# Patient Record
Sex: Male | Born: 1995 | Race: Black or African American | Hispanic: No | Marital: Single | Smoking: Current some day smoker
Health system: Southern US, Community
[De-identification: ages and names within clinical notes are randomized; demographics above are authoritative.]

## PROBLEM LIST (undated history)

## (undated) DIAGNOSIS — Z9889 Other specified postprocedural states: Secondary | ICD-10-CM

## (undated) DIAGNOSIS — M1990 Unspecified osteoarthritis, unspecified site: Secondary | ICD-10-CM

## (undated) HISTORY — PX: KNEE SURGERY: SHX244

## (undated) HISTORY — PX: FEMUR FRACTURE SURGERY: SHX633

---
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
## 2013-02-14 ENCOUNTER — Observation Stay (HOSPITAL_COMMUNITY)
Admission: EM | Admit: 2013-02-14 | Discharge: 2013-02-15 | Payer: Medicaid Other | Attending: Orthopedic Surgery | Admitting: Orthopedic Surgery

## 2013-02-14 ENCOUNTER — Emergency Department (HOSPITAL_COMMUNITY): Payer: Medicaid Other

## 2013-02-14 ENCOUNTER — Encounter (HOSPITAL_COMMUNITY): Payer: Self-pay

## 2013-02-14 DIAGNOSIS — W19XXXA Unspecified fall, initial encounter: Secondary | ICD-10-CM | POA: Insufficient documentation

## 2013-02-14 DIAGNOSIS — S72413A Displaced unspecified condyle fracture of lower end of unspecified femur, initial encounter for closed fracture: Principal | ICD-10-CM | POA: Insufficient documentation

## 2013-02-14 DIAGNOSIS — M083 Juvenile rheumatoid polyarthritis (seronegative): Secondary | ICD-10-CM | POA: Insufficient documentation

## 2013-02-14 HISTORY — DX: Unspecified osteoarthritis, unspecified site: M19.90

## 2013-02-14 HISTORY — DX: Other specified postprocedural states: Z98.890

## 2013-02-14 LAB — CBC WITH DIFFERENTIAL/PLATELET
Basophils Absolute: 0 10*3/uL (ref 0.0–0.1)
Basophils Relative: 0 % (ref 0–1)
Eosinophils Absolute: 0 10*3/uL (ref 0.0–0.7)
Eosinophils Relative: 0 % (ref 0–5)
HCT: 40.2 % (ref 39.0–52.0)
Hemoglobin: 12.9 g/dL — ABNORMAL LOW (ref 13.0–17.0)
Lymphocytes Relative: 8 % — ABNORMAL LOW (ref 12–46)
Lymphs Abs: 2 10*3/uL (ref 0.7–4.0)
MCH: 26.1 pg (ref 26.0–34.0)
MCHC: 32.1 g/dL (ref 30.0–36.0)
MCV: 81.4 fL (ref 78.0–100.0)
Monocytes Absolute: 1.2 10*3/uL — ABNORMAL HIGH (ref 0.1–1.0)
Monocytes Relative: 5 % (ref 3–12)
Neutro Abs: 20.3 10*3/uL — ABNORMAL HIGH (ref 1.7–7.7)
Neutrophils Relative %: 86 % — ABNORMAL HIGH (ref 43–77)
Platelets: 389 10*3/uL (ref 150–400)
RBC: 4.94 MIL/uL (ref 4.22–5.81)
RDW: 14.1 % (ref 11.5–15.5)
WBC: 23.6 10*3/uL — ABNORMAL HIGH (ref 4.0–10.5)

## 2013-02-14 LAB — BASIC METABOLIC PANEL
BUN: 8 mg/dL (ref 6–23)
CO2: 26 mEq/L (ref 19–32)
Calcium: 8.9 mg/dL (ref 8.4–10.5)
Chloride: 102 mEq/L (ref 96–112)
Creatinine, Ser: 0.65 mg/dL (ref 0.50–1.35)
GFR calc Af Amer: >90 mL/min (ref >90)
GFR calc non Af Amer: >90 mL/min (ref >90)
Glucose, Bld: 104 mg/dL — ABNORMAL HIGH (ref 70–99)
Potassium: 3.6 mEq/L (ref 3.5–5.1)
Sodium: 139 mEq/L (ref 135–145)

## 2013-02-14 LAB — TYPE AND SCREEN
ABO/RH(D): B POS
Antibody Screen: NEGATIVE

## 2013-02-14 MED ORDER — HYDROMORPHONE HCL PF 1 MG/ML IJ SOLN
1.0000 mg | Freq: Once | INTRAMUSCULAR | Status: AC
  Administered 2013-02-14: 1 mg via INTRAVENOUS
  Filled 2013-02-14: qty 1

## 2013-02-14 MED ORDER — FENTANYL CITRATE 0.05 MG/ML IJ SOLN
100.0000 ug | Freq: Once | INTRAMUSCULAR | Status: AC
  Administered 2013-02-14: 100 ug via INTRAVENOUS

## 2013-02-14 MED ORDER — SODIUM CHLORIDE 0.9 % IV SOLN
Freq: Once | INTRAVENOUS | Status: AC
  Administered 2013-02-14: 16:00:00 via INTRAVENOUS

## 2013-02-14 MED ORDER — HYDROMORPHONE HCL PF 1 MG/ML IJ SOLN
INTRAMUSCULAR | Status: AC
  Administered 2013-02-15: 1 mg via INTRAVENOUS
  Filled 2013-02-14: qty 1

## 2013-02-14 MED ORDER — FENTANYL CITRATE 0.05 MG/ML IJ SOLN
50.0000 ug | INTRAMUSCULAR | Status: DC | PRN
  Administered 2013-02-14 – 2013-02-15 (×7): 50 ug via INTRAVENOUS
  Filled 2013-02-14 (×7): qty 2

## 2013-02-14 MED ORDER — HYDROMORPHONE HCL PF 1 MG/ML IJ SOLN
1.0000 mg | INTRAMUSCULAR | Status: AC | PRN
  Administered 2013-02-14 – 2013-02-15 (×3): 1 mg via INTRAVENOUS
  Filled 2013-02-14 (×3): qty 1

## 2013-02-14 MED ORDER — FENTANYL CITRATE 0.05 MG/ML IJ SOLN
INTRAMUSCULAR | Status: AC
  Filled 2013-02-14: qty 2

## 2013-02-14 NOTE — ED Notes (Signed)
Pt reports stepped off of porch and accidentally stepped on his pants and fell.  C/O pain to r knee. Deformity noted.  Pt.  Pt tearful.

## 2013-02-14 NOTE — ED Notes (Signed)
Attempted to call report. Was advised nurse receiving pt will call this nurse back for report. 

## 2013-02-14 NOTE — ED Provider Notes (Signed)
History     This chart was scribed for Vida Roller, MD, MD by Smitty Pluck, ED Scribe. The patient was seen in room APA10/APA10 and the patient's care was started at 3:22 PM.   CSN: [id number]  Arrival date & time 02/14/13  1520   None     Chief Complaint  Patient presents with  . Knee Pain    The history is provided by the patient, a parent and medical records. No language interpreter was used.   Billy Stevenson is a 17 y.o. male with hx of juvenile RA who presents to the Emergency Department complaining of constant, severe right knee pain associated with swelling and deformity with sudden onset today. Pt reports that he stepped down on his right leg and caught his leg on his jeans causing him to fall and land on right knee He denies LOC, fever, chills, nausea, vomiting, diarrhea, weakness, cough, SOB and any other pain.   Past Medical History  Diagnosis Date  . Arthritis     juvenile  . Status post arthroscopy of left knee     meniscal repair    Past Surgical History  Procedure Laterality Date  . Knee surgery Left     meniscal repair    No family history on file.  History  Substance Use Topics  . Smoking status: Not on file  . Smokeless tobacco: Not on file  . Alcohol Use: Yes     Comment: occ      Review of Systems  Musculoskeletal: Positive for arthralgias. Negative for back pain.  Neurological: Negative for syncope, numbness and headaches.  All other systems reviewed and are negative.    Allergies  Other  Home Medications   Current Outpatient Rx  Name  Route  Sig  Dispense  Refill  . calcium-vitamin D (OSCAL WITH D) 500-200 MG-UNIT per tablet   Oral   Take 1 tablet by mouth daily.         . diclofenac (VOLTAREN) 50 MG EC tablet   Oral   Take 50 mg by mouth 2 (two) times daily.         . folic acid (FOLVITE) 1 MG tablet   Oral   Take 1 mg by mouth daily.         . methotrexate 1 G injection   Intravenous   Inject 25 mg into the vein  once a week. On Friday           BP 136/72  Pulse 92  Temp(Src) 98 F (36.7 C) (Oral)  Resp 20  Ht 5\' 6"  (1.676 m)  Wt 212 lb (96.163 kg)  BMI 34.23 kg/m2  SpO2 98%  Physical Exam  Nursing note and vitals reviewed. Constitutional: He is oriented to person, place, and time. He appears well-developed and well-nourished. He appears distressed.  Tearful   HENT:  Head: Normocephalic and atraumatic.  Eyes: Conjunctivae are normal. Pupils are equal, round, and reactive to light.  Neck: Normal range of motion. Neck supple.  Cardiovascular: Regular rhythm and normal heart sounds.  Tachycardia present.   No murmur heard. Pulse are nl at feet bilaterally  Pulmonary/Chest: Effort normal and breath sounds normal. No respiratory distress. He has no wheezes. He has no rales.  Abdominal: Soft. Bowel sounds are normal. He exhibits no distension. There is no tenderness. There is no rebound and no guarding.  Musculoskeletal:  Swollen right knee Hold in flex position All other joints of Upper Extremities and Lower Extremities  appear nl and are supple  Neurological: He is alert and oriented to person, place, and time.  Skin: Skin is warm and dry.  Psychiatric: He has a normal mood and affect. His behavior is normal.    ED Course  Procedures (including critical care time) DIAGNOSTIC STUDIES: Oxygen Saturation is 98% on room air, normal by my interpretation.    COORDINATION OF CARE: 3:26 PM Discussed ED treatment with pt and pt agrees.     Labs Reviewed  CBC WITH DIFFERENTIAL - Abnormal; Notable for the following:    WBC 23.6 (*)    Hemoglobin 12.9 (*)    Neutrophils Relative 86 (*)    Neutro Abs 20.3 (*)    Lymphocytes Relative 8 (*)    Monocytes Absolute 1.2 (*)    All other components within normal limits  BASIC METABOLIC PANEL - Abnormal; Notable for the following:    Glucose, Bld 104 (*)    All other components within normal limits  TYPE AND SCREEN   Ct Knee Right Wo  Contrast  02/14/2013  *RADIOLOGY REPORT*  Clinical Data: Knee pain post fall, question femoral metaphyseal fracture on knee radiographs.  CT OF THE RIGHT KNEE WITHOUT CONTRAST  Technique:  Multidetector CT imaging was performed according to the standard protocol. Multiplanar CT image reconstructions were also generated. Knee imaged in flexion with nonstandard axial images.  Comparison: Right knee radiographs 02/14/2013  Findings: Marked osseous demineralization. Patella, tibia, and fibula intact. Oblique mildly displaced fracture of the lateral femoral condyle, with fracture plane extending into the intercondylar notch. Tricompartmental osteoarthritic changes of joint space narrowing marginal spur formation greatest at patellofemoral joint. Additional transverse fracture plane is seen at the distal femoral metaphysis extending transversely just above the femoral condyles, minimally displaced. No additional femoral fractures identified. Minimal knee joint effusion and regional soft tissue swelling.  IMPRESSION: Marked osseous demineralization. Comminuted distal right femoral fracture with a transverse metaphyseal fracture plane minimally displaced and a minimally displaced oblique fracture plane across the lateral femoral condyle into the intercondylar notch. Tricompartmental osteoarthritic changes.   Original Report Authenticated By: Ulyses Southward, M.D.    Dg Knee Ap/lat W/sunrise Right  02/14/2013  *RADIOLOGY REPORT*  Clinical Data: Pain after fall.  DG KNEE - 3 VIEWS  Comparison: None.  Findings: Projections are atypical because of patient positioning. No joint effusion is evident.  I think there is a fracture of the distal femoral metaphysis.  This may be slightly impacted.  If proper positioning cannot be achieved, CT scan could be considered for better evaluation.  IMPRESSION: Likely fracture of the femoral metaphysis.  Atypical positioning on these radiographs.  If proper positioning cannot be achieved, CT  scan could be considered.   Original Report Authenticated By: Paulina Fusi, M.D.      1. Fracture, femur, distal, right, closed, initial encounter       MDM  The patient has a distal right femoral fracture which is closed, I have discussed the patient's care with Dr. Romeo Apple who agrees that the patient needs to be admitted to the hospital, he will work on finding appropriate for surgical care for the patient given his complicating rheumatoid disease. The patient's pain has been relatively well controlled with fentanyl and Dilaudid, his lab work showed that he is not anemic, he has a significant leukocytosis likely related to the stress of the injury and his medication use. He will be splinted in a position of comfort which is likely in a flexed position and temporary holding  orders will be written by myself at the request of the admitting doctor, patient and family members informed of the results and plan of care.    I personally performed the services described in this documentation, which was scribed in my presence. The recorded information has been reviewed and is accurate.         Vida Roller, MD 02/14/13 [phone]

## 2013-02-15 ENCOUNTER — Encounter (HOSPITAL_COMMUNITY): Payer: Self-pay | Admitting: *Deleted

## 2013-02-15 MED ORDER — PNEUMOCOCCAL VAC POLYVALENT 25 MCG/0.5ML IJ INJ
0.5000 mL | INJECTION | INTRAMUSCULAR | Status: DC
  Filled 2013-02-15: qty 0.5

## 2013-02-15 MED ORDER — ONDANSETRON HCL 4 MG/2ML IJ SOLN: 4.0000 mg | Freq: Three times a day (TID) | INTRAMUSCULAR | Status: AC | PRN

## 2013-02-15 MED ORDER — SODIUM CHLORIDE 0.9 % IJ SOLN
10.0000 mL | INTRAMUSCULAR | Status: DC | PRN
  Administered 2013-02-15: 10 mL via INTRAVENOUS

## 2013-02-15 MED ORDER — SODIUM CHLORIDE 0.9 % IV SOLN: INTRAVENOUS | Status: DC

## 2013-02-15 NOTE — Progress Notes (Signed)
Attempted to call report. Told nurse would call me back

## 2013-02-15 NOTE — H&P (Signed)
Billy Stevenson is an 17 y.o. male.   Chief Complaint: Pain right femur/knee HPI: 17 year old male followed at [facility] for rheumatoid arthritis presents with fall and deformity of right distal femur. Patient cannot ambulate complains of severe pain around his knee and distal femur not associated with any numbness or tingling. He was checking out houses feet became painful he landed on a flexed knee and cannot straighten his knee at this time. His x-rays show a distal femur fracture which was confirmed by CT scan. He has significant demineralization of his bone with an intra-articular fracture with a transverse component so call T-type distal femur fracture with little angulation or displacement. He is ambulatory normally.  Past Medical History  Diagnosis Date  . Arthritis     juvenile  . Status post arthroscopy of left knee     meniscal repair    Past Surgical History  Procedure Laterality Date  . Knee surgery Left     meniscal repair    History reviewed. No pertinent family history. Social History:  reports that he has been smoking Cigars.  He does not have any smokeless tobacco history on file. He reports that  drinks alcohol. He reports that he uses illicit drugs (Marijuana).  Allergies:  Allergies  Allergen Reactions  . Other     nuts    Medications Prior to Admission  Medication Sig Dispense Refill  . calcium-vitamin D (OSCAL WITH D) 500-200 MG-UNIT per tablet Take 1 tablet by mouth daily.      . diclofenac (VOLTAREN) 50 MG EC tablet Take 50 mg by mouth 2 (two) times daily.      . folic acid (FOLVITE) 1 MG tablet Take 1 mg by mouth daily.      . methotrexate 1 G injection Inject 25 mg into the vein once a week. On Friday        Results for orders placed during the hospital encounter of 02/14/13 (from the past 48 hour(s))  CBC WITH DIFFERENTIAL     Status: Abnormal   Collection Time    02/14/13  5:45 PM      Result Value Range   WBC 23.6 (*) 4.0 - 10.5 K/uL   RBC 4.94  4.22  - 5.81 MIL/uL   Hemoglobin 12.9 (*) 13.0 - 17.0 g/dL   HCT 29.5  62.1 - 30.8 %   MCV 81.4  78.0 - 100.0 fL   MCH 26.1  26.0 - 34.0 pg   MCHC 32.1  30.0 - 36.0 g/dL   RDW 65.7  84.6 - 96.2 %   Platelets 389  150 - 400 K/uL   Neutrophils Relative 86 (*) 43 - 77 %   Neutro Abs 20.3 (*) 1.7 - 7.7 K/uL   Lymphocytes Relative 8 (*) 12 - 46 %   Lymphs Abs 2.0  0.7 - 4.0 K/uL   Monocytes Relative 5  3 - 12 %   Monocytes Absolute 1.2 (*) 0.1 - 1.0 K/uL   Eosinophils Relative 0  0 - 5 %   Eosinophils Absolute 0.0  0.0 - 0.7 K/uL   Basophils Relative 0  0 - 1 %   Basophils Absolute 0.0  0.0 - 0.1 K/uL  BASIC METABOLIC PANEL     Status: Abnormal   Collection Time    02/14/13  5:45 PM      Result Value Range   Sodium 139  135 - 145 mEq/L   Potassium 3.6  3.5 - 5.1 mEq/L   Chloride 102  96 - 112 mEq/L   CO2 26  19 - 32 mEq/L   Glucose, Bld 104 (*) 70 - 99 mg/dL   BUN 8  6 - 23 mg/dL   Creatinine, Ser 4.09  0.50 - 1.35 mg/dL   Calcium 8.9  8.4 - 81.1 mg/dL   GFR calc non Af Amer >90  >90 mL/min   GFR calc Af Amer >90  >90 mL/min   Comment:            The eGFR has been calculated     using the CKD EPI equation.     This calculation has not been     validated in all clinical     situations.     eGFR's persistently     <90 mL/min signify     possible Chronic Kidney Disease.  TYPE AND SCREEN     Status: None   Collection Time    02/14/13  5:45 PM      Result Value Range   ABO/RH(D) B POS     Antibody Screen NEG     Sample Expiration 02/17/2013     Ct Knee Right Wo Contrast  02/14/2013  *RADIOLOGY REPORT*  Clinical Data: Knee pain post fall, question femoral metaphyseal fracture on knee radiographs.  CT OF THE RIGHT KNEE WITHOUT CONTRAST  Technique:  Multidetector CT imaging was performed according to the standard protocol. Multiplanar CT image reconstructions were also generated. Knee imaged in flexion with nonstandard axial images.  Comparison: Right knee radiographs 02/14/2013   Findings: Marked osseous demineralization. Patella, tibia, and fibula intact. Oblique mildly displaced fracture of the lateral femoral condyle, with fracture plane extending into the intercondylar notch. Tricompartmental osteoarthritic changes of joint space narrowing marginal spur formation greatest at patellofemoral joint. Additional transverse fracture plane is seen at the distal femoral metaphysis extending transversely just above the femoral condyles, minimally displaced. No additional femoral fractures identified. Minimal knee joint effusion and regional soft tissue swelling.  IMPRESSION: Marked osseous demineralization. Comminuted distal right femoral fracture with a transverse metaphyseal fracture plane minimally displaced and a minimally displaced oblique fracture plane across the lateral femoral condyle into the intercondylar notch. Tricompartmental osteoarthritic changes.   Original Report Authenticated By: Ulyses Southward, M.D.    Dg Knee Ap/lat W/sunrise Right  02/14/2013  *RADIOLOGY REPORT*  Clinical Data: Pain after fall.  DG KNEE - 3 VIEWS  Comparison: None.  Findings: Projections are atypical because of patient positioning. No joint effusion is evident.  I think there is a fracture of the distal femoral metaphysis.  This may be slightly impacted.  If proper positioning cannot be achieved, CT scan could be considered for better evaluation.  IMPRESSION: Likely fracture of the femoral metaphysis.  Atypical positioning on these radiographs.  If proper positioning cannot be achieved, CT scan could be considered.   Original Report Authenticated By: Paulina Fusi, M.D.     ROS  Blood pressure 145/79, pulse 100, temperature 99.2 F (37.3 C), temperature source Oral, resp. rate 19, height 5\' 6"  (1.676 m), weight 212 lb (96.163 kg), SpO2 99.00%. Physical Exam  Vitals reviewed.  BP 145/79  Pulse 100  Temp(Src) 99.2 F (37.3 C) (Oral)  Resp 19  Ht 5\' 6"  (1.676 m)  Wt 212 lb (96.163 kg)  BMI 34.23  kg/m2  SpO2 99% The patient is awake alert and oriented x3 he is comfortable after pain medication. He has a body habitus which are short round and mildly obese. His mood and affect are normal.  He cannot ambulate.  Upper extremity exam  The right and left upper extremity:   Inspection and palpation revealed no abnormalities in the upper extremities.   Range of motion is full without contracture.  Motor exam is normal with grade 5 strength.  The joints are fully reduced without subluxation.  There is no atrophy or tremor and muscle tone is normal.  All joints are stable.   His left lower extremity has no tenderness, range of motion is full without contracture motor exam is normal grade 5 strength twice reduced without subluxation no atrophy muscle tone normal  Right leg is held in flexion compartments are soft is in a posterior splint  Assessment/Plan The patient is probably a candidate for surgical fixation based on his age. I am not convinced that this fracture will be stable treated with nonoperative means. He would also be difficult for him to regain normal range of motion in his right knee if it was kept in a cast for a long period of time.  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 The stomach fixation difficult. You also need management of his rheumatoid medications. He is currently on methotrexate he just saw his doctor at [facility] in the last week or 2.  I have spoken to him and recommended that he be transferred to [facility] for further management of his fracture and then his rheumatologist can assist in the management of his medications in the perioperative.  We have no experience with that he and the fracture fixation will be problematic as well.   IMPRESSION:  Marked osseous demineralization.  Comminuted distal right femoral fracture with a transverse  metaphyseal fracture  plane minimally displaced and a minimally  displaced oblique fracture plane across the lateral femoral condyle  into the intercondylar notch.  Tricompartmental osteoarthritic changes.  Our plan is to transfer him to [facility] for further management   Fuller Canada 02/15/2013, 7:41 AM

## 2013-02-15 NOTE — Discharge Summary (Signed)
Physician Discharge Summary  Patient ID: Billy Stevenson MRN: [id number] DOB/AGE: 23-Jan-1996 17 y.o.  Admit date: 02/14/2013 Discharge date: 02/15/2013  Admission Diagnoses: Right distal femur fracture  Discharge Diagnoses: Right distal femur fracture Active Problems:   * No active hospital problems. *   Discharged Condition: good  Hospital Course: The patient was admitted on observation status on March 12 secondary to right distal femur fracture with intra-articular component so-called T. type femur fracture. This placed in a splint at 90 flexion because he would not straighten his knee for what we believe to be secondary to pain. He did have a CT scan which showed the details of the fracture. He's been in a splint he was evaluated this morning approximately 7:30 and was neurovascularly intact with soft compartments. He was comfortable with IV pain medication.  Consults: None  Significant Diagnostic Studies: CT scan  Treatments: IV hydration and analgesia: Dilaudid  Discharge Exam: Blood pressure 145/79, pulse 100, temperature 99.2 F (37.3 C), temperature source Oral, resp. rate 19, height 5\' 6"  (1.676 m), weight 212 lb (96.163 kg), SpO2 99.00%. Physical examination he has a wound tie shaped face which may or may not be syndromic. He has mild obesity. He has a splint on his right leg with soft compartments of the thigh and calf.  Disposition: Final discharge disposition not confirmed disposition [facility]  Discharge Orders   Future Orders Complete By Expires     Diet - low sodium heart healthy  As directed     Discharge instructions  As directed     Comments:      Continue right lower leg splint    Increase activity slowly  As directed         Medication List    STOP taking these medications       diclofenac 50 MG EC tablet  Commonly known as:  VOLTAREN      TAKE these medications       calcium-vitamin D 500-200 MG-UNIT per tablet   Commonly known as:  OSCAL WITH D  Take 1 tablet by mouth daily.     folic acid 1 MG tablet  Commonly known as:  FOLVITE  Take 1 mg by mouth daily.     methotrexate 1 G injection  Inject 25 mg into the vein once a week. On Friday         Signed: Fuller Canada 02/15/2013, 10:19 AM

## 2013-02-15 NOTE — Plan of Care (Signed)
Problem: Discharge Progression Outcomes Goal: Discharge plan in place and appropriate Outcome: Adequate for Discharge Pt transferred to [facility]

## 2013-05-19 ENCOUNTER — Encounter (HOSPITAL_COMMUNITY): Payer: Self-pay

## 2013-05-19 ENCOUNTER — Emergency Department (HOSPITAL_COMMUNITY): Payer: Medicaid Other

## 2013-05-19 ENCOUNTER — Emergency Department (HOSPITAL_COMMUNITY)
Admission: EM | Admit: 2013-05-19 | Discharge: 2013-05-19 | Disposition: A | Discharge status: Home / Self Care | Payer: Medicaid Other | Attending: Emergency Medicine | Admitting: Emergency Medicine

## 2013-05-19 DIAGNOSIS — Z8739 Personal history of other diseases of the musculoskeletal system and connective tissue: Secondary | ICD-10-CM | POA: Insufficient documentation

## 2013-05-19 DIAGNOSIS — K920 Hematemesis: Secondary | ICD-10-CM | POA: Insufficient documentation

## 2013-05-19 DIAGNOSIS — Z79899 Other long term (current) drug therapy: Secondary | ICD-10-CM | POA: Insufficient documentation

## 2013-05-19 DIAGNOSIS — F17200 Nicotine dependence, unspecified, uncomplicated: Secondary | ICD-10-CM | POA: Insufficient documentation

## 2013-05-19 DIAGNOSIS — R109 Unspecified abdominal pain: Secondary | ICD-10-CM | POA: Insufficient documentation

## 2013-05-19 LAB — COMPREHENSIVE METABOLIC PANEL
AST: 12 U/L (ref 0–37)
Albumin: 3.4 g/dL — ABNORMAL LOW (ref 3.5–5.2)
Chloride: 102 mEq/L (ref 96–112)
Creatinine, Ser: 0.63 mg/dL (ref 0.50–1.35)
Potassium: 3.9 mEq/L (ref 3.5–5.1)
Sodium: 138 mEq/L (ref 135–145)
Total Bilirubin: 0.1 mg/dL — ABNORMAL LOW (ref 0.3–1.2)

## 2013-05-19 LAB — CBC WITH DIFFERENTIAL/PLATELET
Eosinophils Absolute: 0 10*3/uL (ref 0.0–0.7)
Eosinophils Relative: 0 % (ref 0–5)
Lymphs Abs: 2.1 10*3/uL (ref 0.7–4.0)
MCH: 24 pg — ABNORMAL LOW (ref 26.0–34.0)
MCV: 75.4 fL — ABNORMAL LOW (ref 78.0–100.0)
Monocytes Relative: 4 % (ref 3–12)
Platelets: 515 10*3/uL — ABNORMAL HIGH (ref 150–400)
RBC: 5.04 MIL/uL (ref 4.22–5.81)

## 2013-05-19 MED ORDER — IOHEXOL 300 MG/ML  SOLN
100.0000 mL | Freq: Once | INTRAMUSCULAR | Status: AC | PRN
  Administered 2013-05-19: 100 mL via INTRAVENOUS

## 2013-05-19 MED ORDER — OXYCODONE-ACETAMINOPHEN 5-325 MG PO TABS: 1.0000 | ORAL_TABLET | Freq: Four times a day (QID) | ORAL | Status: DC | PRN

## 2013-05-19 MED ORDER — SODIUM CHLORIDE 0.9 % IV BOLUS (SEPSIS)
500.0000 mL | Freq: Once | INTRAVENOUS | Status: AC
  Administered 2013-05-19: 500 mL via INTRAVENOUS

## 2013-05-19 MED ORDER — ONDANSETRON 8 MG PO TBDP: 8.0000 mg | ORAL_TABLET | Freq: Three times a day (TID) | ORAL | Status: AC | PRN

## 2013-05-19 MED ORDER — ONDANSETRON HCL 4 MG/2ML IJ SOLN
4.0000 mg | Freq: Once | INTRAMUSCULAR | Status: AC
  Administered 2013-05-19: 4 mg via INTRAVENOUS
  Filled 2013-05-19: qty 2

## 2013-05-19 MED ORDER — IOHEXOL 300 MG/ML  SOLN
50.0000 mL | Freq: Once | INTRAMUSCULAR | Status: AC | PRN
  Administered 2013-05-19: 50 mL via ORAL

## 2013-05-19 NOTE — ED Notes (Signed)
Pt drinking contrast for ct scan 

## 2013-05-19 NOTE — ED Provider Notes (Signed)
History    This chart was scribed for [employer]. Rubin Payor, MD by Sofie Rower, ED Scribe. The patient was seen in room [facility] and the patient's care was started at 5:52PM.    CSN: [id number]  Arrival date & time 05/19/13  1659   First MD Initiated Contact with Patient 05/19/13 1752      Chief Complaint  Patient presents with  . Emesis    (Consider location/radiation/quality/duration/timing/severity/associated sxs/prior treatment) The history is provided by the patient. No language interpreter was used.    Billy Stevenson is a 17 y.o. male , with a hx of arthritis, left knee meniscus repair and femur fracture surgery, who presents to the Emergency Department complaining of sudden, progressively worsening, emesis (X 3 today), onset today (05/19/13).  Associated symptoms include nausea. The pt reports he experienced three episodes of vomiting at 3:00PM this afternoon, followed by an episode of hematemesis at 3:15PM. The pt informs the hematemesis episode prompted his concern and desire to seek medical evaluation at [facility] this evening (05/19/13).   The pt denies diarrhea, hemoptysis, light headedness, dizziness, melena and hematochezia.  Furthermore, the pt denies any hx of ulcers.  The pt is a current some day smoker, in addition to drinking alcohol occasionally.   PCP is Dr. Loleta Chance.    Past Medical History  Diagnosis Date  . Arthritis     juvenile  . Status post arthroscopy of left knee     meniscal repair    Past Surgical History  Procedure Laterality Date  . Knee surgery Left     meniscal repair  . Femur fracture surgery      No family history on file.  History  Substance Use Topics  . Smoking status: Current Some Day Smoker    Types: Cigars  . Smokeless tobacco: Not on file  . Alcohol Use: Yes     Comment: occ      Review of Systems  Gastrointestinal: Positive for nausea and vomiting. Negative for blood in stool and anal bleeding.  All other systems reviewed  and are negative.    Allergies  Amoxicillin and Peanut-containing drug products  Home Medications   Current Outpatient Rx  Name  Route  Sig  Dispense  Refill  . calcium-vitamin D (OSCAL WITH D) 500-200 MG-UNIT per tablet   Oral   Take 1 tablet by mouth daily.         . diclofenac (VOLTAREN) 75 MG EC tablet   Oral   Take 75 mg by mouth 2 (two) times daily.         . folic acid (FOLVITE) 1 MG tablet   Oral   Take 1 mg by mouth daily.         . ondansetron (ZOFRAN-ODT) 8 MG disintegrating tablet   Oral   Take 1 tablet (8 mg total) by mouth every 8 (eight) hours as needed for nausea.   20 tablet   0   . oxyCODONE-acetaminophen (PERCOCET/ROXICET) 5-325 MG per tablet   Oral   Take 1-2 tablets by mouth every 6 (six) hours as needed for pain.   6 tablet   0     BP 132/75  Pulse 102  Temp(Src) 99.1 F (37.3 C) (Oral)  Resp 20  Ht 5\' 6"  (1.676 m)  Wt 220 lb (99.791 kg)  BMI 35.53 kg/m2  SpO2 99%  Physical Exam  Nursing note and vitals reviewed. Constitutional: He is oriented to person, place, and time. He appears well-developed and well-nourished.  No distress.  HENT:  Head: Normocephalic and atraumatic.  Right Ear: External ear normal.  Left Ear: External ear normal.  Nose: Nose normal.  Mouth/Throat: Oropharynx is clear and moist.  Eyes: EOM are normal. Pupils are equal, round, and reactive to light.  Neck: Neck supple. No tracheal deviation present.  Cardiovascular: Normal rate.   Pulmonary/Chest: Effort normal. No respiratory distress.  Abdominal: Soft. He exhibits no distension. There is tenderness in the epigastric area. There is no rebound and no guarding.  No ecchymosis detected.   Musculoskeletal: Normal range of motion. He exhibits no edema.  Neurological: He is alert and oriented to person, place, and time. No sensory deficit.  Skin: Skin is warm and dry.  Psychiatric: He has a normal mood and affect. His behavior is normal.    ED Course   Procedures (including critical care time)  DIAGNOSTIC STUDIES:   COORDINATION OF CARE:  6:18 PM- Treatment plan discussed with patient. Pt agrees with treatment.  8:41 PM- Recheck. Treatment plan concerning laboratory results discussed with patient. Pt agrees with treatment.        Results for orders placed during the hospital encounter of 05/19/13  COMPREHENSIVE METABOLIC PANEL      Result Value Range   Sodium 138  135 - 145 mEq/L   Potassium 3.9  3.5 - 5.1 mEq/L   Chloride 102  96 - 112 mEq/L   CO2 26  19 - 32 mEq/L   Glucose, Bld 92  70 - 99 mg/dL   BUN 14  6 - 23 mg/dL   Creatinine, Ser 9.60  0.50 - 1.35 mg/dL   Calcium 9.2  8.4 - 45.4 mg/dL   Total Protein 7.1  6.0 - 8.3 g/dL   Albumin 3.4 (*) 3.5 - 5.2 g/dL   AST 12  0 - 37 U/L   ALT 11  0 - 53 U/L   Alkaline Phosphatase 74  39 - 117 U/L   Total Bilirubin 0.1 (*) 0.3 - 1.2 mg/dL   GFR calc non Af Amer >90  >90 mL/min   GFR calc Af Amer >90  >90 mL/min  CBC WITH DIFFERENTIAL      Result Value Range   WBC 24.0 (*) 4.0 - 10.5 K/uL   RBC 5.04  4.22 - 5.81 MIL/uL   Hemoglobin 12.1 (*) 13.0 - 17.0 g/dL   HCT 09.8 (*) 11.9 - 14.7 %   MCV 75.4 (*) 78.0 - 100.0 fL   MCH 24.0 (*) 26.0 - 34.0 pg   MCHC 31.8  30.0 - 36.0 g/dL   RDW 82.9  56.2 - 13.0 %   Platelets 515 (*) 150 - 400 K/uL   Neutrophils Relative % 87 (*) 43 - 77 %   Neutro Abs 20.9 (*) 1.7 - 7.7 K/uL   Lymphocytes Relative 9 (*) 12 - 46 %   Lymphs Abs 2.1  0.7 - 4.0 K/uL   Monocytes Relative 4  3 - 12 %   Monocytes Absolute 0.9  0.1 - 1.0 K/uL   Eosinophils Relative 0  0 - 5 %   Eosinophils Absolute 0.0  0.0 - 0.7 K/uL   Basophils Relative 0  0 - 1 %   Basophils Absolute 0.1  0.0 - 0.1 K/uL  LIPASE, BLOOD      Result Value Range   Lipase 96 (*) 11 - 59 U/L   Dg Abd Acute W/chest  05/19/2013   *RADIOLOGY REPORT*  Clinical Data: Abdominal pain, nausea and vomiting.  ACUTE ABDOMEN SERIES (ABDOMEN 2 VIEW & CHEST 1 VIEW)  Comparison: Abdomen dated  08/29/2008 and chest dated 01/22/2006.  Findings: Normal sized heart.  Clear lungs.  Small to moderate- sized hiatal hernia.  Normal bowel gas pattern without free peritoneal air.  Unremarkable bones.  IMPRESSION:  1.  No acute abnormality. 2.  Small to moderate-sized hiatal hernia.   Original Report Authenticated By: Beckie Salts, M.D.      1. Abdominal pain   2. Hematemesis       MDM  Patient with nausea vomiting abdominal pain hematemesis. Lab work is reassuring but has a mildly elevated white count. He is not anemic. His lipase is mildly elevated. CT scan was done and did not show any acute pathology. Patient be discharged home to followup with gastroenterology      I personally performed the services described in this documentation, which was scribed in my presence. The recorded information has been reviewed and is accurate.     Juliet Rude. Rubin Payor, MD 05/19/13 2348

## 2013-05-19 NOTE — ED Notes (Signed)
Pt reports vomited x 3 today and had saw bright red blood in emesis.  Reports the last time he vomited, it was dark red blood.  Pt said he has a "clogged" feeling in his chest.

## 2013-05-19 NOTE — ED Notes (Signed)
Patient transported to CT 

## 2013-06-11 ENCOUNTER — Ambulatory Visit (HOSPITAL_COMMUNITY)
Admission: RE | Admit: 2013-06-11 | Discharge: 2013-06-11 | Disposition: A | Discharge status: Home / Self Care | Payer: Medicaid Other | Source: Ambulatory Visit | Attending: Physician Assistant | Admitting: Physician Assistant

## 2013-06-11 DIAGNOSIS — M6281 Muscle weakness (generalized): Secondary | ICD-10-CM | POA: Insufficient documentation

## 2013-06-11 DIAGNOSIS — R269 Unspecified abnormalities of gait and mobility: Secondary | ICD-10-CM | POA: Insufficient documentation

## 2013-06-11 DIAGNOSIS — R262 Difficulty in walking, not elsewhere classified: Secondary | ICD-10-CM | POA: Insufficient documentation

## 2013-06-11 DIAGNOSIS — R29898 Other symptoms and signs involving the musculoskeletal system: Secondary | ICD-10-CM | POA: Insufficient documentation

## 2013-06-11 DIAGNOSIS — M25661 Stiffness of right knee, not elsewhere classified: Secondary | ICD-10-CM | POA: Insufficient documentation

## 2013-06-11 DIAGNOSIS — M25669 Stiffness of unspecified knee, not elsewhere classified: Secondary | ICD-10-CM | POA: Insufficient documentation

## 2013-06-11 DIAGNOSIS — IMO0001 Reserved for inherently not codable concepts without codable children: Secondary | ICD-10-CM | POA: Insufficient documentation

## 2013-06-11 NOTE — Evaluation (Signed)
Physical Therapy Evaluation  Patient Details  Name: Billy Stevenson MRN: [id number] Date of Birth: Dec 01, 1996  Today's Date: 06/11/2013 Time: 1110-1205 PT Time Calculation (min): 55 min              Visit#: 1 of 24  Re-eval: 07/11/13 Assessment Diagnosis: distal femur fracture. Surgical Date: 02/14/13 Next MD Visit: 08/29/2013  Authorization: medicaid     Past Medical History:  Past Medical History  Diagnosis Date  . Arthritis     juvenile  . Status post arthroscopy of left knee     meniscal repair   Past Surgical History:  Past Surgical History  Procedure Laterality Date  . Knee surgery Left     meniscal repair  . Femur fracture surgery      Subjective Symptoms/Limitations Symptoms: Billy Stevenson states that he fell and fractured his leg on 02/14/2013.  The MD wanted him NWB for three months.  He is now able to weight bear but he is so weak that he is unable to do so therefore he is being referred to therapy. 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 How long can you stand comfortably?: He is able to stand with crutches for less than five minutes How long can you walk comfortably?: The longest he has walked has been five feet.   Patient Stated Goals: Pt states that he was able to do all ADL's; go up and down steps without difficulty, get in and out of a car without difficulty, walk with no assistive device.   Pain Assessment Currently in Pain?: Yes Pain Score: 5  Pain Location: Ankle Pain Orientation: Left Pain Type: Chronic pain Pain Onset: More than a month ago Pain Frequency: Intermittent (when weight bearing) Pain Relieving Factors: becoming non-weight bearing.  Precautions/Restrictions  Restrictions Weight Bearing Restrictions: No Other Position/Activity Restrictions:  (Pt was NWB now able to begin gradual weightbearing as tolera)  Balance Screening Balance Screen Has the  patient fallen in the past 6 months: Yes How many times?: 1 Has the patient had a decrease in activity level because of a fear of falling? : Yes Is the patient reluctant to leave their home because of a fear of falling? : No  Prior Functioning  Prior Function  Able to Take Stairs?: Yes Driving: No Vocation: Student  Cognition/Observation Cognition Overall Cognitive Status: Within Functional Limits for tasks assessed  Sensation/Coordination/Flexibility/Functional Tests Functional Tests Functional Tests: LEFS 0/80  Assessment RLE AROM (degrees) Right Knee Extension:  ((-15)) Right Knee Flexion: 85 RLE Strength Right Hip Flexion: 5/5 Right Hip Extension: 2-/5 Right Hip ABduction: 3/5 Right Hip ADduction: 3/5 Right Knee Flexion: 3+/5 Right Knee Extension: 3/5 Right Ankle Dorsiflexion: 2/5 Right Ankle Plantar Flexion: 2/5 LLE AROM (degrees) Left Knee Extension:  ((-10)) Left Knee Flexion: 100 LLE Strength Left Hip Flexion: 5/5 Left Hip Extension: 2-/5 Left Hip ABduction: 3/5 Left Hip ADduction: 3/5 Left Knee Flexion: 3+/5 Left Knee Extension: 3+/5 Left Ankle Dorsiflexion: 2/5 Left Ankle Plantar Flexion: 2/5  Exercise/Treatments Mobility/Balance  Transfers Sit to Stand: 1: +2 Total assist (seat raised to 22.5" pt can come sit to stand with mod x 45") Sit to Stand: Patient Percentage: 10% Transfer via Lift Equipment:  (transfer via sliding board I )    Standing Other Standing Knee Exercises: sit to stand with mat elevated for increased ease   Supine Quad Sets: Both;10 reps Heel Slides: Right;10 reps Bridges: 5 reps Sidelying  Hip ABduction: Both;5 reps Hip ADduction: Both;5 reps Prone  Hip Extension: Both;5 reps   Physical Therapy Assessment and Plan PT Assessment and Plan Clinical Impression Statement: Pt s/p distal femur fracture with decreased strength, difficulty standing and walking who will benefit from skilled PT to maximize his functional I.  Pt  will benefit from skilled therapeutic intervention in order to improve on the following deficits: Decreased activity tolerance;Decreased balance;Decreased mobility;Decreased range of motion;Decreased strength;Difficulty walking Rehab Potential: Good PT Frequency: Min 3X/week PT Duration: 8 weeks PT Treatment/Interventions: Gait training;Stair training;Therapeutic activities;Therapeutic exercise;Balance training;Neuromuscular re-education PT Plan: begin nustep, sit to stand with mat raised to 22", standing tolerance, exercises, gt with walker. Pt to work on strengthening, balance and gait.    Goals Home Exercise Program Pt/caregiver will Perform Home Exercise Program: increased ROM;increased strength PT Goal: Perform Home Exercise Program - Progress: Goal set today PT Short Term Goals Time to Complete Short Term Goals: 4 weeks PT Short Term Goal 1: Pt able to come sit to stand from normal chair with mod assist of one. PT Short Term Goal 2: Pt to be able to stand x 5 minutes with min assisst PT Short Term Goal 3: Pt to be able to walk with walker with min assist x 163ft. PT Short Term Goal 4: LEFS to be improved by 20  points to improve functional mobilit PT Long Term Goals Time to Complete Long Term Goals: 8 weeks PT Long Term Goal 1: I in advanced HEP PT Long Term Goal 2: Pt to be able to come from sit to stand with Mod I.  Long Term Goal 3: Pt to be able to walk for 30 minutes with crutches with mod I to be able to complete light housework, go for short grocery store trips,. Long Term Goal 4: Pt to be able to stand for 20 minutes at a time to be able to shower I  PT Long Term Goal 5: Pt to be able to ascend and descend steps wtih hand rail. Additional PT Long Term Goals?: Yes PT Long Term Goal 6: LEFS to be improved by 40 point to improve functional mobility.  Problem List Patient Active Problem List   Diagnosis Date Noted  . Difficulty in walking(719.7) 06/11/2013  . Weakness of both  legs 06/11/2013  . Stiffness of right knee 06/11/2013  . Abnormality of gait 09/22/2011  . Muscle weakness (generalized) 09/22/2011    PT - End of Session Equipment Utilized During Treatment: Gait belt Activity Tolerance: Patient tolerated treatment well General Behavior During Therapy: WFL for tasks assessed/performed PT Plan of Care PT Home Exercise Plan: given  GP Functional Assessment Tool Used: LEFS/clinical judgement Functional Limitation: Mobility: Walking and moving around Mobility: Walking and Moving Around Current Status (W2956): At least 80 percent but less than 100 percent impaired, limited or restricted Mobility: Walking and Moving Around Goal Status [id number]): At least 20 percent but less than 40 percent impaired, limited or restricted  RUSSELL,CINDY 06/11/2013, 1:51 PM  Physician Documentation Your signature is required to indicate approval of the treatment plan as stated above.  Please sign and either send electronically or make a copy of this report for your files and return this physician signed original.   Please mark one 1.__approve of plan  2. ___approve of plan with the following conditions.   ______________________________  _____________________ Physician Signature                                                                                                             Date

## 2013-06-13 ENCOUNTER — Ambulatory Visit (HOSPITAL_COMMUNITY)
Admission: RE | Admit: 2013-06-13 | Discharge: 2013-06-13 | Disposition: A | Discharge status: Home / Self Care | Payer: Medicaid Other | Source: Ambulatory Visit | Attending: Family Medicine | Admitting: Family Medicine

## 2013-06-13 NOTE — Progress Notes (Signed)
Physical Therapy Treatment Patient Details  Name: Billy Stevenson MRN: [id number] Date of Birth: 04-29-1996  Today's Date: 06/13/2013 Time: 1350-1430 PT Time Calculation (min): 40 min  Visit#: 2 of 24  Re-eval: 07/11/13 Charges: Therex x 38' (1352-1430)  Authorization: medicaid    Subjective: Symptoms/Limitations Symptoms: Pt is pain free at entry. Pt reports continued HEP compliance. Pain Assessment Currently in Pain?: No/denies   Exercise/Treatments Standing Other Standing Knee Exercises: sit to stand with mat at 21" x 5 holding for max of 2' Seated Other Seated Knee Exercises: Marching x 10 bilateral Other Seated Knee Exercises: Add/abd isolmetric squeeze  Physical Therapy Assessment and Plan PT Assessment and Plan Clinical Impression Statement: Tx focus on improving functional strength. Pt is able to stand at walker but tends to put weight throughout his forearms rather than his wrists secondary to wrist discomfort. Had pt stand at a raised table with support from forearms. Using this technique pt was able to stand much taller. Pt displays weakness in gluteal and lumbar musculature with standing. Pt tolerates tx well and is without complaint throughout session. Pt will benefit from skilled therapeutic intervention in order to improve on the following deficits: Decreased activity tolerance;Decreased balance;Decreased mobility;Decreased range of motion;Decreased strength;Difficulty walking Rehab Potential: Good PT Frequency: Min 3X/week PT Duration: 8 weeks PT Treatment/Interventions: Gait training;Stair training;Therapeutic activities;Therapeutic exercise;Balance training;Neuromuscular re-education PT Plan: Begin nustep, sit to stand with mat raised to 22", standing tolerance, exercises, gt with walker. Pt to work on strengthening, balance and gait.     Problem List Patient Active Problem List   Diagnosis Date Noted  . Difficulty in walking(719.7) 06/11/2013  . Weakness of  both legs 06/11/2013  . Stiffness of right knee 06/11/2013  . Abnormality of gait 09/22/2011  . Muscle weakness (generalized) 09/22/2011    PT - End of Session Equipment Utilized During Treatment: Gait belt Activity Tolerance: Patient tolerated treatment well General Behavior During Therapy: [facility] for tasks assessed/performed  GP Functional Assessment Tool Used: LEFS/clinical judgement  Seth Bake, PTA  06/13/2013, 4:13 PM

## 2013-06-15 ENCOUNTER — Ambulatory Visit (HOSPITAL_COMMUNITY)
Admission: RE | Admit: 2013-06-15 | Discharge: 2013-06-15 | Disposition: A | Discharge status: Home / Self Care | Payer: Medicaid Other | Source: Ambulatory Visit | Attending: Neurosurgery | Admitting: Neurosurgery

## 2013-06-15 DIAGNOSIS — M25661 Stiffness of right knee, not elsewhere classified: Secondary | ICD-10-CM

## 2013-06-15 DIAGNOSIS — R29898 Other symptoms and signs involving the musculoskeletal system: Secondary | ICD-10-CM

## 2013-06-15 DIAGNOSIS — R262 Difficulty in walking, not elsewhere classified: Secondary | ICD-10-CM

## 2013-06-15 NOTE — Progress Notes (Signed)
Physical Therapy Treatment Patient Details  Name: Billy Stevenson MRN: [id number] Date of Birth: November 20, 1996  Today's Date: 06/15/2013 Time: 5621-3086 PT Time Calculation (min): 43 min Charge:  therex 1540-1600; there activity 1600-1623 Visit#: 3 of 24  Re-eval: 07/11/13    Authorization: medicaid  Authorization Time Period: thru 9/3.2014  Authorization Visit#: 3 of 24   Subjective: Symptoms/Limitations Symptoms: Pt states exercises are aggrevating his ankles Pain Assessment Currently in Pain?: Yes Pain Score: 4  Pain Location: Ankle Pain Type: Chronic pain    Exercise/Treatments   Standing Other Standing Knee Exercises: sit to stand with mat at 21" x 5 holding for max of 2' Other Standing Knee Exercises: side stepping with walker at mat 1 RT Seated Long Arc Quad: 10 reps;Weights Long Arc Quad Weight: 3 lbs. Supine Quad Sets: 10 reps Heel Slides: 10 reps Bridges: 10 reps Sidelying Hip ABduction: 10 reps;Limitations Hip ABduction Limitations: L  3# ; R O#  Physical Therapy Assessment and Plan PT Assessment and Plan Clinical Impression Statement: Pt treatment focused on increasing ROM and standing tolerance. Pt will benefit from skilled therapeutic intervention in order to improve on the following deficits: Decreased activity tolerance;Decreased balance;Decreased mobility;Decreased range of motion;Decreased strength;Difficulty walking Rehab Potential: Good PT Frequency: Min 3X/week PT Duration: 8 weeks PT Treatment/Interventions: Gait training;Stair training;Therapeutic activities;Therapeutic exercise;Balance training;Neuromuscular re-education PT Plan: beging Nustep next treatment    Goals  progressing  Problem List Patient Active Problem List   Diagnosis Date Noted  . Difficulty in walking(719.7) 06/11/2013  . Weakness of both legs 06/11/2013  . Stiffness of right knee 06/11/2013  . Abnormality of gait 09/22/2011  . Muscle weakness (generalized) 09/22/2011     PT - End of Session Equipment Utilized During Treatment: Gait belt General Behavior During Therapy: [facility] for tasks assessed/performed  GP    RUSSELL,CINDY 06/15/2013, 4:31 PM

## 2013-06-18 ENCOUNTER — Ambulatory Visit (HOSPITAL_COMMUNITY): Payer: Medicaid Other | Admitting: Physical Therapy

## 2013-06-20 ENCOUNTER — Ambulatory Visit (HOSPITAL_COMMUNITY)
Admission: RE | Admit: 2013-06-20 | Discharge: 2013-06-20 | Disposition: A | Discharge status: Home / Self Care | Payer: Medicaid Other | Source: Ambulatory Visit | Attending: Family Medicine | Admitting: Family Medicine

## 2013-06-20 DIAGNOSIS — R262 Difficulty in walking, not elsewhere classified: Secondary | ICD-10-CM

## 2013-06-20 DIAGNOSIS — M25661 Stiffness of right knee, not elsewhere classified: Secondary | ICD-10-CM

## 2013-06-20 DIAGNOSIS — R29898 Other symptoms and signs involving the musculoskeletal system: Secondary | ICD-10-CM

## 2013-06-20 NOTE — Progress Notes (Signed)
Physical Therapy Treatment Patient Details  Name: Billy Stevenson MRN: [id number] Date of Birth: 31-Oct-1996  Today's Date: 06/20/2013 Time: 1110-1203 PT Time Calculation (min): 53 min Charges: TE: 1110-1140 TA: 1140-1203 Visit#: 4 of 24  Re-eval: 07/11/13 Assessment Diagnosis: distal femur fracture. Surgical Date: 02/14/13 Next MD Visit: Lubertha Basque, PA-C 08/29/13  Authorization: medicaid  Authorization Time Period: thru 9/3.2014  Authorization Visit#: 4 of 24   Subjective: Symptoms/Limitations Symptoms: Reports that he is not having as much pain from the exercises.  Left leg still feels wek.  Reports he wants to be able to go to the beach on August 15th walking with crutches and out of his W/C.  Pain Assessment Currently in Pain?: Yes Pain Score: 3  Pain Location: Ankle Pain Orientation: Left  Precautions/Restrictions  Restrictions Weight Bearing Restrictions: No  Exercise/Treatments Aerobic Stationary Bike: NuStep: 6 minutes level surface reistance 2 seat 10 Seated Long Arc Quad: Left;15 reps;Weights Long Arc Quad Weight: 4 lbs. Other Seated Knee Exercises: Hamstring curl: Left Green theraband 15 reps Other Seated Knee Exercises: Add isolmetric squeeze 10x10 sec holds; abduction x20 blue tband Standing Standing Eyes Opened: Wide (BOA);Solid surface;5 reps;Time Standing Eyes Opened Time: RW: 1 minute,  2:30, 2:30, Crutches: 1:30, 5 minutes (with side to side weight shift, ant/psot weight shift and attempted marching)  Sit to Stand: Elevated surface;Limitations Sit to Stand Limitations: 5x from 21 inch surface  Transfers: WC<>bed, W/C <>NuStep x2 for activity w/min guard A  Physical Therapy Assessment and Plan PT Assessment and Plan Clinical Impression Statement: Added standing with crutches as pt wishes to ambulate with crutches by the middle of August to go to the beach.  Pt able to tolerate standing 5 minutes with crutches.  Able to go from STS from 21 inch surface  with BUE with mod independence.  Demonstrates difficulty with standing marching activities.  Scar to RLE presents with decreaed fascial restrictions and is now supple to touch.  Added NuStep at end of session.  PT Treatment/Interventions: Engineer, water;Therapeutic activities;Therapeutic exercise;Balance training;Neuromuscular re-education PT Plan: Continue with PT POC to improve LE strengthening to prepare for ambulating with LRAD    Goals Home Exercise Program PT Goal: Perform Home Exercise Program - Progress: Met PT Short Term Goals Time to Complete Short Term Goals: 4 weeks PT Short Term Goal 1: Pt able to come sit to stand from normal chair with mod assist of one. PT Short Term Goal 1 - Progress: Met PT Short Term Goal 2: Pt to be able to stand x 5 minutes with min assisst PT Short Term Goal 2 - Progress: Met PT Short Term Goal 3: Pt to be able to walk with walker with min assist x 170ft. PT Short Term Goal 3 - Progress: Progressing toward goal PT Short Term Goal 4: LEFS to be improved by 20  points to improve functional mobilit PT Short Term Goal 4 - Progress: Progressing toward goal PT Long Term Goals Time to Complete Long Term Goals: 8 weeks PT Long Term Goal 1: I in advanced HEP PT Long Term Goal 1 - Progress: Progressing toward goal PT Long Term Goal 2: Pt to be able to come from sit to stand with Mod I.  PT Long Term Goal 2 - Progress: Progressing toward goal Long Term Goal 3: Pt to be able to walk for 30 minutes with crutches with mod I to be able to complete light housework, go for short grocery store trips,. Long Term Goal 3 Progress: Progressing  toward goal Long Term Goal 4: Pt to be able to stand for 20 minutes at a time to be able to shower I  Long Term Goal 4 Progress: Progressing toward goal PT Long Term Goal 5: Pt to be able to ascend and descend steps wtih hand rail. Long Term Goal 5 Progress: Progressing toward goal Additional PT Long Term Goals?:  Yes PT Long Term Goal 6: LEFS to be improved by 40 point to improve functional mobility. Long Term Goal 6 Progress: Progressing toward goal  Problem List Patient Active Problem List   Diagnosis Date Noted  . Difficulty in walking(719.7) 06/11/2013  . Weakness of both legs 06/11/2013  . Stiffness of right knee 06/11/2013  . Abnormality of gait 09/22/2011  . Muscle weakness (generalized) 09/22/2011    PT - End of Session Equipment Utilized During Treatment: Gait belt General Behavior During Therapy: [facility] for tasks assessed/performed  GP    Jabron Weese, MPT, ATC 06/20/2013, 12:00 PM

## 2013-06-22 ENCOUNTER — Ambulatory Visit (HOSPITAL_COMMUNITY)
Admission: RE | Admit: 2013-06-22 | Discharge: 2013-06-22 | Disposition: A | Discharge status: Home / Self Care | Payer: Medicaid Other | Source: Ambulatory Visit | Attending: Family Medicine | Admitting: Family Medicine

## 2013-06-22 DIAGNOSIS — R262 Difficulty in walking, not elsewhere classified: Secondary | ICD-10-CM

## 2013-06-22 DIAGNOSIS — M25661 Stiffness of right knee, not elsewhere classified: Secondary | ICD-10-CM

## 2013-06-22 DIAGNOSIS — R29898 Other symptoms and signs involving the musculoskeletal system: Secondary | ICD-10-CM

## 2013-06-22 NOTE — Progress Notes (Signed)
Physical Therapy Treatment Patient Details  Name: Billy Stevenson MRN: [id number] Date of Birth: 07-Aug-1996  Today's Date: 06/22/2013 Time: 1115-1203 PT Time Calculation (min): 48 min Charge there ex x 3:1115-1155; gt 4098-1191 [place] Visit#: 5 of 24  Re-eval: 07/11/13    Authorization: medicaid  Authorization Time Period: thru 9/3.2014  Authorization Visit#: 5 of 24   Subjective: Symptoms/Limitations Symptoms: Pt doing HEP; not really sore any longer  Exercise/Treatments   Standing Gait Training: with crutches x 6 feet very slow Seated Long Arc Quad: Both;15 reps;Weights Long Arc Quad Weight: 5 lbs. Supine Quad Sets: Strengthening;Both;10 reps Heel Slides: Both;10 reps Bridges: 10 reps;Limitations Bridges Limitations: ball adduction Knee Extension: PROM;Both Knee Flexion: PROM;Both Other Supine Knee Exercises: resisted IR x 15 Sidelying Hip ABduction: Both;10 reps Hip ABduction Limitations: B 3#      Physical Therapy Assessment and Plan PT Assessment and Plan Clinical Impression Statement: Pt began walking using crutches.  Walking was very slow and labored but able to complete.  Pt needs verbal cuing to keep upright. Pt will benefit from skilled therapeutic intervention in order to improve on the following deficits: Decreased activity tolerance;Decreased balance;Decreased mobility;Decreased range of motion;Decreased strength;Difficulty walking PT Frequency: Min 3X/week PT Duration: 8 weeks PT Treatment/Interventions: Gait training;Stair training;Therapeutic activities;Therapeutic exercise;Balance training;Neuromuscular re-education PT Plan: Continue with PT POC to improve LE strengthening and improving gt with curtches,.    Goals  progressing  Problem List Patient Active Problem List   Diagnosis Date Noted  . Difficulty in walking(719.7) 06/11/2013  . Weakness of both legs 06/11/2013  . Stiffness of right knee 06/11/2013  . Abnormality of gait 09/22/2011  .  Muscle weakness (generalized) 09/22/2011    PT - End of Session Equipment Utilized During Treatment: Gait belt Activity Tolerance: Patient tolerated treatment well  GP Functional Assessment Tool Used: LEFS/clinical judgement  RUSSELL,CINDY 06/22/2013, 4:52 PM

## 2013-06-25 ENCOUNTER — Ambulatory Visit (HOSPITAL_COMMUNITY)
Admission: RE | Admit: 2013-06-25 | Discharge: 2013-06-25 | Disposition: A | Discharge status: Home / Self Care | Payer: Medicaid Other | Source: Ambulatory Visit | Attending: Family Medicine | Admitting: Family Medicine

## 2013-06-25 DIAGNOSIS — M25661 Stiffness of right knee, not elsewhere classified: Secondary | ICD-10-CM

## 2013-06-25 DIAGNOSIS — R29898 Other symptoms and signs involving the musculoskeletal system: Secondary | ICD-10-CM

## 2013-06-25 DIAGNOSIS — R262 Difficulty in walking, not elsewhere classified: Secondary | ICD-10-CM

## 2013-06-27 ENCOUNTER — Ambulatory Visit (HOSPITAL_COMMUNITY)
Admission: RE | Admit: 2013-06-27 | Discharge: 2013-06-27 | Disposition: A | Discharge status: Home / Self Care | Payer: Medicaid Other | Source: Ambulatory Visit | Attending: Physician Assistant | Admitting: Physician Assistant

## 2013-06-27 NOTE — Progress Notes (Signed)
Physical Therapy Treatment Patient Details  Name: Billy Stevenson MRN: [id number] Date of Birth: 07-18-1996  Today's Date: 06/27/2013 Time: 1110-1155 PT Time Calculation (min): 45 min  Visit#: 7 of 24  Re-eval: 07/11/13 Authorization: medicaid  Authorization Time Period: thru 9/3.2014  Authorization Visit#: 6 of 24  Charges: Gait 1115-1140 (25'), self care 1145-1155 (10'')  Subjective: Symptoms/Limitations Symptoms: Pt reports no pain today unless he weight bears then increases in Rt knee and ankle. Pain Assessment Currently in Pain?: No/denies   Exercise/Treatments Standing Gait Training: B UE platform walker 26' X 1 Seated time Supine time Sidelying time Prone time       Physical Therapy Assessment and Plan PT Assessment and Plan Clinical Impression Statement: Focused session on increasing gait ability.  Pt unable to use crutches effectively; unable to weight bear through his hands, thus increasing pressure into his axillary area.  Began ambulation using Bilateral UE platform walker.  Much improved stability, confidence, gait speed/quality and posture.   Pt able to complete 26 feet until needing a rest break.  Discussed PF walker with caregiver and sent an order to MD requesting one for home use. PT Plan: Continue with PT POC to improve LE strengthening and improving gt using PF walker; give pt order for PF walker when received from MD.     Problem List Patient Active Problem List   Diagnosis Date Noted  . Difficulty in walking(719.7) 06/11/2013  . Weakness of both legs 06/11/2013  . Stiffness of right knee 06/11/2013  . Abnormality of gait 09/22/2011  . Muscle weakness (generalized) 09/22/2011    PT - End of Session Equipment Utilized During Treatment: Gait belt Activity Tolerance: Patient tolerated treatment well General Behavior During Therapy: [facility] for tasks assessed/performed   Lurena Nida, PTA/CLT 06/27/2013, 5:28 PM

## 2013-06-29 ENCOUNTER — Ambulatory Visit (HOSPITAL_COMMUNITY)
Admission: RE | Admit: 2013-06-29 | Discharge: 2013-06-29 | Disposition: A | Discharge status: Home / Self Care | Payer: Medicaid Other | Source: Ambulatory Visit | Attending: Physician Assistant | Admitting: Physician Assistant

## 2013-06-29 DIAGNOSIS — M25661 Stiffness of right knee, not elsewhere classified: Secondary | ICD-10-CM

## 2013-06-29 DIAGNOSIS — R29898 Other symptoms and signs involving the musculoskeletal system: Secondary | ICD-10-CM

## 2013-06-29 DIAGNOSIS — R262 Difficulty in walking, not elsewhere classified: Secondary | ICD-10-CM

## 2013-06-29 NOTE — Progress Notes (Signed)
Physical Therapy Treatment Patient Details  Name: Billy Stevenson MRN: [id number] Date of Birth: 05/01/1996  Today's Date: 06/29/2013 Time: 1110-1205 PT Time Calculation (min): 55 min Charge gt 1110-1135 gt 2; there ex 4098-1191 29 (2) Visit#: 8 of 24  Re-eval: 07/11/13    Authorization: medicaid  Authorization Time Period: thru 9/3.2014  Authorization Visit#: 8 of 12   Subjective: Symptoms/Limitations Symptoms: Pt is doing his exerices twice a day Pain Assessment Currently in Pain?: No/denies (pain increases to a 5/10 when therapy is pushing hime)   Exercise/Treatments Mobility/Balance  Transfers Sit to Stand: 2: Max assist Sit to Stand: Patient Percentage: 30% Ambulation/Gait Ambulation/Gait: Yes Ambulation Distance (Feet): 36 Feet Assistive device: Bilateral platform walker    Supine Knee Extension: PROM Knee Flexion: PROM Sidelying Hip ABduction: Strengthening;10 reps Hip ABduction Limitations: B 3# Prone  Hamstring Curl: 10 reps;Limitations Hamstring Curl Limitations: B 3# Hip Extension: Strengthening;Both;10 reps;Limitations Hip Extension Limitations: knee flexed 3 #      Physical Therapy Assessment and Plan PT Assessment and Plan Clinical Impression Statement: Pt treatement focused on improved gait and ROM.  Pt improved to 36 ft;  PT Plan: Continue with PT POC to improve LE strengthening and improving gt using PF walker; give pt order for PF walker when received from MD.    Goals  progressing  Problem List Patient Active Problem List   Diagnosis Date Noted  . Difficulty in walking(719.7) 06/11/2013  . Weakness of both legs 06/11/2013  . Stiffness of right knee 06/11/2013  . Abnormality of gait 09/22/2011  . Muscle weakness (generalized) 09/22/2011    PT - End of Session Equipment Utilized During Treatment: Gait belt Activity Tolerance: Patient tolerated treatment well General Behavior During Therapy: [facility] for tasks assessed/performed  GP     Billy Stevenson,CINDY 06/29/2013, 12:05 PM

## 2013-07-09 ENCOUNTER — Inpatient Hospital Stay (HOSPITAL_COMMUNITY)
Admission: RE | Admit: 2013-07-09 | Discharge: 2013-07-09 | Disposition: A | Discharge status: Home / Self Care | Payer: Medicaid Other | Source: Ambulatory Visit | Attending: *Deleted | Admitting: *Deleted

## 2013-07-09 ENCOUNTER — Ambulatory Visit (HOSPITAL_COMMUNITY)
Admission: RE | Admit: 2013-07-09 | Discharge: 2013-07-09 | Disposition: A | Discharge status: Home / Self Care | Payer: Medicaid Other | Source: Ambulatory Visit | Attending: Family Medicine | Admitting: Family Medicine

## 2013-07-09 DIAGNOSIS — R262 Difficulty in walking, not elsewhere classified: Secondary | ICD-10-CM | POA: Insufficient documentation

## 2013-07-09 DIAGNOSIS — IMO0001 Reserved for inherently not codable concepts without codable children: Secondary | ICD-10-CM | POA: Insufficient documentation

## 2013-07-09 DIAGNOSIS — M25669 Stiffness of unspecified knee, not elsewhere classified: Secondary | ICD-10-CM | POA: Insufficient documentation

## 2013-07-09 DIAGNOSIS — M6281 Muscle weakness (generalized): Secondary | ICD-10-CM | POA: Insufficient documentation

## 2013-07-09 DIAGNOSIS — R269 Unspecified abnormalities of gait and mobility: Secondary | ICD-10-CM | POA: Insufficient documentation

## 2013-07-09 NOTE — Progress Notes (Signed)
Physical Therapy Treatment Patient Details  Name: Billy Stevenson MRN: [id number] Date of Birth: 1996/03/23  Today's Date: 07/09/2013 Time: 1518 (Pt 15' late for appt.)-1545 PT Time Calculation (min): 27 min  Visit#: 9 of 24  Re-eval: 07/11/13 Charges: Gait x 23'  Authorization: medicaid  Authorization Time Period: thru 9/3.2014  Authorization Visit#: 9 of 12   Subjective: Symptoms/Limitations Symptoms: Pt states that he feels most limited by left ankle pain. Pain Assessment Currently in Pain?: Yes Pain Score: 5  Pain Location: Ankle Pain Orientation: Left   Exercise/Treatments Standing Gait Training: B UE platform walker 25' X 1 Other Standing Knee Exercises: Standing tall with correct posture x 5'  Physical Therapy Assessment and Plan PT Assessment and Plan Clinical Impression Statement: Tx limited by time. Treatment focus on improving tolerance for gait and gait quality. Pt requires frequent cueing to flex knees with toe off-swing through. Pt also requires multimodal cueing to improve posture. Visible muscle fatigue noted with standing with correct posture. Signed order for platform walker given to patient. PT Plan: Continue with PT POC to improve LE strengthening and improving gt using platform walker.     Problem List Patient Active Problem List   Diagnosis Date Noted  . Difficulty in walking(719.7) 06/11/2013  . Weakness of both legs 06/11/2013  . Stiffness of right knee 06/11/2013  . Abnormality of gait 09/22/2011  . Muscle weakness (generalized) 09/22/2011    PT - End of Session Equipment Utilized During Treatment: Gait belt Activity Tolerance: Patient tolerated treatment well General Behavior During Therapy: [facility] for tasks assessed/performed   Seth Bake, PTA  07/09/2013, 5:16 PM

## 2013-07-11 ENCOUNTER — Ambulatory Visit (HOSPITAL_COMMUNITY): Payer: Medicaid Other | Admitting: *Deleted

## 2013-07-16 ENCOUNTER — Ambulatory Visit (HOSPITAL_COMMUNITY)
Admission: RE | Admit: 2013-07-16 | Discharge: 2013-07-16 | Disposition: A | Discharge status: Home / Self Care | Payer: Medicaid Other | Source: Ambulatory Visit | Attending: Family Medicine | Admitting: Family Medicine

## 2013-07-16 NOTE — Progress Notes (Signed)
Physical Therapy Treatment Patient Details  Name: Billy Stevenson MRN: [id number] Date of Birth: 1996/05/06  Today's Date: 07/16/2013 Time: 8657-8469 PT Time Calculation (min): 30 min Visit#: 9 of 24  Re-eval: 07/11/13 Authorization: medicaid  Authorization Time Period: thru 9/3.2014  Authorization Visit#: 9 of 12  Charges:  Gait 27'  Subjective: Symptoms/Limitations Symptoms: Pt was 18 minutes late.  STates he had an MD appt. in [place].  Pt states he's been working on standing and has tried to walk without AD (has not gotten his PF walker yet) but unable to take a complete step.   Exercise/Treatments Standing Gait Training: B UE platform walker 45' X 1 in 20 minutes Other Standing Knee Exercises: Standing tall with correct posture x 5' Seated Other Seated Knee Exercises: sit to stand with 2' standing tolerance.      Physical Therapy Assessment and Plan PT Assessment and Plan Clinical Impression Statement: Unable to complete full session due to pt. being late for appt.  Focused on sit to stand decreasing use of UE's and increasing safety.   Pt able to complete further distance of 45 feet before needing to rest, however ambulates very slowly taking 20 minutes to complete.   PT Plan: Continue with PT POC to improve LE strengthening and improving gt using platform walker.  F/U on delivery of PF walker to home.       Problem List Patient Active Problem List   Diagnosis Date Noted  . Difficulty in walking(719.7) 06/11/2013  . Weakness of both legs 06/11/2013  . Stiffness of right knee 06/11/2013  . Abnormality of gait 09/22/2011  . Muscle weakness (generalized) 09/22/2011    PT - End of Session Equipment Utilized During Treatment: Gait belt Activity Tolerance: Patient tolerated treatment well General Behavior During Therapy: [facility] for tasks assessed/performed   Lurena Nida, PTA/CLT 07/16/2013, 6:14 PM

## 2013-07-18 ENCOUNTER — Ambulatory Visit (HOSPITAL_COMMUNITY)
Admission: RE | Admit: 2013-07-18 | Discharge: 2013-07-18 | Disposition: A | Discharge status: Home / Self Care | Payer: Medicaid Other | Source: Ambulatory Visit | Attending: Family Medicine | Admitting: Family Medicine

## 2013-07-18 NOTE — Evaluation (Signed)
Physical Therapy Evaluation  Patient Details  Name: Billy Stevenson MRN: [id number] Date of Birth: 1996/07/09  Today's Date: 07/18/2013 Time: 4401-0272 PT Time Calculation (min): 45 min              Visit#: 10 of 24  Re-eval: 08/17/13    Authorization: medicaid    Authorization Time Period: thru 9/3.2014  Authorization Visit#: 10 of 24   Past Medical History:  Past Medical History  Diagnosis Date  . Arthritis     juvenile  . Status post arthroscopy of left knee     meniscal repair   Past Surgical History:  Past Surgical History  Procedure Laterality Date  . Knee surgery Left     meniscal repair  . Femur fracture surgery      Subjective  I'm standing at home and doing my exercises   Functional Tests Functional Tests: LEFS 12/80  Assessment RLE AROM (degrees) Right Knee Extension:  (-10 was -15) Right Knee Flexion: 98 (was 85) RLE Strength Right Hip Flexion: 5/5 Right Hip Extension: 3/5 (was 2-/5) Right Hip ABduction: 3+/5 (was 3/5) Right Hip ADduction:  (4-/5 was 3/5) Right Knee Flexion: 4/5 (was 3+/5) Right Knee Extension:  (5-/5 was 3/5) Right Ankle Dorsiflexion: 3+/5 (in available range was 2/5) Right Ankle Plantar Flexion: 3-/5 (in available range was 2/5) LLE AROM (degrees) Left Knee Extension: 8 (was 10) Left Knee Flexion: 103 (was 100) LLE Strength Left Hip Flexion: 5/5 Left Hip Extension: 2+/5 (was 2-/5) Left Hip ABduction: 4/5 (was 3/5) Left Hip ADduction: 5/5 (was 3/5) Left Knee Flexion: 5/5 (was 3+) Left Knee Extension: 5/5 (was a 3+/5) Left Ankle Dorsiflexion: 3+/5 (in available range was 2/5) Left Ankle Plantar Flexion: 3-/5 (in available range was 2/5)  Exercise/Treatments Gt x 100 ft.; stand without any UE support x 1 min x 2  Physical Therapy Assessment and Plan PT Assessment and Plan Clinical Impression Statement: Pt has improved in all strengths and significant improvment in mobility.   Pt will benefit from continued skilled PT to  return pt to I in ambulation witnout AD. Pt will benefit from skilled therapeutic intervention in order to improve on the following deficits: Decreased activity tolerance;Decreased balance;Decreased mobility;Decreased range of motion;Decreased strength;Difficulty walking PT Frequency: Min 3X/week PT Duration: 4 more  weeks PT Treatment/Interventions: Gait training;Stair training;Therapeutic activities;Therapeutic exercise;Balance training;Neuromuscular re-education PT Plan: Continue with PT POC to improve LE strengthening and improving gt using platform walker.  F/U on delivery of PF walker to home.      Goals PT Short Term Goals Time to Complete Short Term Goals: 4 weeks PT Short Term Goal 1: Pt able to come sit to stand from normal chair with mod assist of one. PT Short Term Goal 1 - Progress: Met PT Short Term Goal 2: Pt to be able to stand x 5 minutes with min assisst PT Short Term Goal 2 - Progress: Met PT Short Term Goal 3: Pt to be able to walk with walker with min assist x 185ft. PT Short Term Goal 4 - Progress: Progressing toward goal PT Long Term Goals Time to Complete Long Term Goals: 8 weeks PT Long Term Goal 1: I in advanced HEP PT Long Term Goal 1 - Progress: Progressing toward goal PT Long Term Goal 2: Pt to be able to come from sit to stand with Mod I.  PT Long Term Goal 2 - Progress: Progressing toward goal Long Term Goal 3: Pt to be able to walk for 30 minutes  with crutches with mod I to be able to complete light housework, go for short grocery store trips,. Long Term Goal 3 Progress: Progressing toward goal Long Term Goal 4: Pt to be able to stand for 20 minutes at a time to be able to shower I  Long Term Goal 4 Progress: Progressing toward goal PT Long Term Goal 5: Pt to be able to ascend and descend steps wtih hand rail. Long Term Goal 5 Progress: Progressing toward goal PT Long Term Goal 6: LEFS to be improved by 40 point to improve functional mobility. Long Term  Goal 6 Progress: Progressing toward goal  Problem List Patient Active Problem List   Diagnosis Date Noted  . Difficulty in walking(719.7) 06/11/2013  . Weakness of both legs 06/11/2013  . Stiffness of right knee 06/11/2013  . Abnormality of gait 09/22/2011  . Muscle weakness (generalized) 09/22/2011    PT - End of Session Equipment Utilized During Treatment: Gait belt Activity Tolerance: Patient tolerated treatment well  GP    RUSSELL,CINDY 07/18/2013, 5:38 PM  Physician Documentation Your signature is required to indicate approval of the treatment plan as stated above.  Please sign and either send electronically or make a copy of this report for your files and return this physician signed original.   Please mark one 1.__approve of plan  2. ___approve of plan with the following conditions.   ______________________________                                                          _____________________ Physician Signature                                                                                                             Date

## 2013-07-23 ENCOUNTER — Ambulatory Visit (HOSPITAL_COMMUNITY): Payer: Medicaid Other | Admitting: Physical Therapy

## 2013-07-25 ENCOUNTER — Ambulatory Visit (HOSPITAL_COMMUNITY): Payer: Medicaid Other | Admitting: *Deleted

## 2013-07-27 ENCOUNTER — Ambulatory Visit (HOSPITAL_COMMUNITY): Payer: Medicaid Other | Admitting: Physical Therapy

## 2015-02-10 ENCOUNTER — Encounter (HOSPITAL_COMMUNITY): Payer: Self-pay | Admitting: *Deleted

## 2015-02-10 ENCOUNTER — Emergency Department (HOSPITAL_COMMUNITY)
Admission: EM | Admit: 2015-02-10 | Discharge: 2015-02-10 | Disposition: A | Discharge status: Home / Self Care | Payer: Medicare Other | Attending: Emergency Medicine | Admitting: Emergency Medicine

## 2015-02-10 ENCOUNTER — Emergency Department (HOSPITAL_COMMUNITY): Payer: Medicare Other

## 2015-02-10 DIAGNOSIS — S63502A Unspecified sprain of left wrist, initial encounter: Secondary | ICD-10-CM | POA: Insufficient documentation

## 2015-02-10 DIAGNOSIS — Z880 Allergy status to penicillin: Secondary | ICD-10-CM | POA: Diagnosis not present

## 2015-02-10 DIAGNOSIS — Z9889 Other specified postprocedural states: Secondary | ICD-10-CM | POA: Insufficient documentation

## 2015-02-10 DIAGNOSIS — Z79899 Other long term (current) drug therapy: Secondary | ICD-10-CM | POA: Insufficient documentation

## 2015-02-10 DIAGNOSIS — Y998 Other external cause status: Secondary | ICD-10-CM | POA: Insufficient documentation

## 2015-02-10 DIAGNOSIS — W010XXA Fall on same level from slipping, tripping and stumbling without subsequent striking against object, initial encounter: Secondary | ICD-10-CM | POA: Diagnosis not present

## 2015-02-10 DIAGNOSIS — Y9389 Activity, other specified: Secondary | ICD-10-CM | POA: Diagnosis not present

## 2015-02-10 DIAGNOSIS — S6992XA Unspecified injury of left wrist, hand and finger(s), initial encounter: Secondary | ICD-10-CM | POA: Diagnosis not present

## 2015-02-10 DIAGNOSIS — Z720 Tobacco use: Secondary | ICD-10-CM | POA: Insufficient documentation

## 2015-02-10 DIAGNOSIS — Y92511 Restaurant or cafe as the place of occurrence of the external cause: Secondary | ICD-10-CM | POA: Insufficient documentation

## 2015-02-10 DIAGNOSIS — S8991XA Unspecified injury of right lower leg, initial encounter: Secondary | ICD-10-CM | POA: Diagnosis not present

## 2015-02-10 DIAGNOSIS — S8001XA Contusion of right knee, initial encounter: Secondary | ICD-10-CM | POA: Insufficient documentation

## 2015-02-10 DIAGNOSIS — M1990 Unspecified osteoarthritis, unspecified site: Secondary | ICD-10-CM | POA: Insufficient documentation

## 2015-02-10 DIAGNOSIS — M25532 Pain in left wrist: Secondary | ICD-10-CM | POA: Diagnosis not present

## 2015-02-10 DIAGNOSIS — M25461 Effusion, right knee: Secondary | ICD-10-CM | POA: Diagnosis not present

## 2015-02-10 MED ORDER — IBUPROFEN 600 MG PO TABS: 600.0000 mg | ORAL_TABLET | Freq: Four times a day (QID) | ORAL | Status: AC | PRN

## 2015-02-10 MED ORDER — OXYCODONE-ACETAMINOPHEN 5-325 MG PO TABS: 1.0000 | ORAL_TABLET | ORAL | Status: AC | PRN

## 2015-02-10 MED ORDER — OXYCODONE-ACETAMINOPHEN 5-325 MG PO TABS
1.0000 | ORAL_TABLET | Freq: Once | ORAL | Status: AC
  Administered 2015-02-10: 1 via ORAL
  Filled 2015-02-10: qty 1

## 2015-02-10 MED ORDER — HYDROMORPHONE HCL 1 MG/ML IJ SOLN
1.0000 mg | Freq: Once | INTRAMUSCULAR | Status: AC
  Administered 2015-02-10: 1 mg via INTRAMUSCULAR
  Filled 2015-02-10: qty 1

## 2015-02-10 MED ORDER — TETANUS-DIPHTH-ACELL PERTUSSIS 5-2.5-18.5 LF-MCG/0.5 IM SUSP
0.5000 mL | Freq: Once | INTRAMUSCULAR | Status: AC
  Administered 2015-02-10: 0.5 mL via INTRAMUSCULAR
  Filled 2015-02-10: qty 0.5

## 2015-02-10 NOTE — ED Provider Notes (Signed)
Patient seen by me. Patient status post fall onto his right knee said he went right down on the knee there was no twisting. He just tripped and fell patient was feeling fine before hand.  X-ray of the knee is negative for any bony abnormalities. Patient possibly could have some injury to the patellar tendon. He Is in place and no dislocation moderate swelling but no significant effusion. Patient with pain to either flex or extend the knee. Patient not able to completely extend the knee. But is able to get it to about probably 140 straight.  Recommend knee immobilizer pain control and crutches. Patient is followed by orthopedic surgery in [place] for arthritis problems juvenile arthritis problems and other bone problems.   Results for orders placed or performed during the hospital encounter of 05/19/13  Comprehensive metabolic panel  Result Value Ref Range   Sodium 138 135 - 145 mEq/L   Potassium 3.9 3.5 - 5.1 mEq/L   Chloride 102 96 - 112 mEq/L   CO2 26 19 - 32 mEq/L   Glucose, Bld 92 70 - 99 mg/dL   BUN 14 6 - 23 mg/dL   Creatinine, Ser 2.99 0.50 - 1.35 mg/dL   Calcium 9.2 8.4 - 24.2 mg/dL   Total Protein 7.1 6.0 - 8.3 g/dL   Albumin 3.4 (L) 3.5 - 5.2 g/dL   AST 12 0 - 37 U/L   ALT 11 0 - 53 U/L   Alkaline Phosphatase 74 39 - 117 U/L   Total Bilirubin 0.1 (L) 0.3 - 1.2 mg/dL   GFR calc non Af Amer >90 >90 mL/min   GFR calc Af Amer >90 >90 mL/min  CBC with Differential  Result Value Ref Range   WBC 24.0 (H) 4.0 - 10.5 K/uL   RBC 5.04 4.22 - 5.81 MIL/uL   Hemoglobin 12.1 (L) 13.0 - 17.0 g/dL   HCT 68.3 (L) 41.9 - 62.2 %   MCV 75.4 (L) 78.0 - 100.0 fL   MCH 24.0 (L) 26.0 - 34.0 pg   MCHC 31.8 30.0 - 36.0 g/dL   RDW 29.7 98.9 - 21.1 %   Platelets 515 (H) 150 - 400 K/uL   Neutrophils Relative % 87 (H) 43 - 77 %   Neutro Abs 20.9 (H) 1.7 - 7.7 K/uL   Lymphocytes Relative 9 (L) 12 - 46 %   Lymphs Abs 2.1 0.7 - 4.0 K/uL   Monocytes Relative 4 3 - 12 %   Monocytes Absolute 0.9 0.1 -  1.0 K/uL   Eosinophils Relative 0 0 - 5 %   Eosinophils Absolute 0.0 0.0 - 0.7 K/uL   Basophils Relative 0 0 - 1 %   Basophils Absolute 0.1 0.0 - 0.1 K/uL  Lipase, blood  Result Value Ref Range   Lipase 96 (H) 11 - 59 U/L   Dg Wrist Complete Left  02/10/2015   CLINICAL DATA:  Status post fall today with pain in the radial side of the wrist.  EXAM: LEFT WRIST - COMPLETE 3+ VIEW  COMPARISON:  None.  FINDINGS: There is no evidence of fracture or dislocation. There is diffuse osteopenia. There is chronic deformity of the carpal bones, metacarpals, visualize ulna and radius.  IMPRESSION: No acute fracture or dislocation. 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   Electronically Signed   By: Sherian Rein M.D.   On: 02/10/2015 13:57   Dg Knee Complete 4 Views Right  02/10/2015   CLINICAL DATA:  Status post  fall with trauma to the front of right knee. Pain and swelling of the right knee.  EXAM: RIGHT KNEE - COMPLETE 4+ VIEW  COMPARISON:  CT of the right knee March 12 14  FINDINGS: There is no evidence of fracture, dislocation. There is diffuse osteopenia. There is prior fixation of the distal femur. Degenerative joint changes with narrowed joint space, osteophyte formation and small suprapatellar effusion are noted.  IMPRESSION: No acute fracture or dislocation. Diffuse osteopenia. Prior fixation of distal femur.   Electronically Signed   By: Sherian Rein M.D.   On: 02/10/2015 14:05     Vanetta Mulders, MD   Medical screening examination/treatment/procedure(s) were conducted as a shared visit with non-physician practitioner(s) and myself.  I personally evaluated the patient during the encounter.   EKG Interpretation None        Vanetta Mulders, MD 02/10/15 [phone]

## 2015-02-10 NOTE — ED Provider Notes (Signed)
CSN: [id number]     Arrival date & time 02/10/15  1257 History  This chart was scribed for non-physician practitioner, Burgess Amor, PA-C working with Benjiman Core, MD, by Jarvis Morgan, ED Scribe. This patient was seen in room APFT22/APFT22 and the patient's care was started at 2:33 PM.     Chief Complaint  Patient presents with  . Fall    The history is provided by the patient. No language interpreter was used.    HPI Comments: Billy Stevenson is a 19 y.o. male with a h/o juvenile arthritis who presents to the Emergency Department complaining of constant, moderate, left wrist and right knee pain due to a fall that occurred PTA. He states he was stepping onto a wheelchair ramp outside a restaurant from the side, tripping over the edge. He tried to catch his fall with his left wrist and also fell on his right knee. Pt is having associated swelling of left wrist and right knee. He denies any head injury or LOC. Pt states he has been unable to bear weight on his right knee since the fall, multiple bystanders had to get him into the vehicle then was driven here by a relative. His guardian states he has had previous surgery to that knee at [facility]. Pt is unsure when his last tetanus was. He has  rhematoid arthritis treated at [facility]  His guardian states that he regularly takes tramadol and Voltaren. He denies any other complaints at this time.        Past Medical History  Diagnosis Date  . Arthritis     juvenile  . Status post arthroscopy of left knee     meniscal repair   Past Surgical History  Procedure Laterality Date  . Knee surgery Left     meniscal repair  . Femur fracture surgery     History reviewed. No pertinent family history. History  Substance Use Topics  . Smoking status: Current Some Day Smoker    Types: Cigars  . Smokeless tobacco: Not on file  . Alcohol Use: Yes     Comment: occ    Review of Systems  Constitutional: Negative for fever.  Musculoskeletal: Positive  for joint swelling and arthralgias. Negative for myalgias.  Skin: Negative for color change and wound.  Neurological: Negative for syncope, weakness and numbness.      Allergies  Amoxicillin and Peanut-containing drug products  Home Medications   Prior to Admission medications   Medication Sig Start Date End Date Taking? Authorizing Provider  calcium-vitamin D (OSCAL WITH D) 500-200 MG-UNIT per tablet Take 1 tablet by mouth daily.    Historical Provider, MD  diclofenac (VOLTAREN) 75 MG EC tablet Take 75 mg by mouth 2 (two) times daily.    Historical Provider, MD  folic acid (FOLVITE) 1 MG tablet Take 1 mg by mouth daily.    Historical Provider, MD  ibuprofen (ADVIL,MOTRIN) 600 MG tablet Take 1 tablet (600 mg total) by mouth every 6 (six) hours as needed. 02/10/15   Burgess Amor, PA-C  ondansetron (ZOFRAN-ODT) 8 MG disintegrating tablet Take 1 tablet (8 mg total) by mouth every 8 (eight) hours as needed for nausea. 05/19/13   Benjiman Core, MD  oxyCODONE-acetaminophen (PERCOCET/ROXICET) 5-325 MG per tablet Take 1 tablet by mouth every 4 (four) hours as needed. 02/10/15   Burgess Amor, PA-C   Triage Vitals: BP 127/87 mmHg  Pulse 98  Temp(Src) 98.2 F (36.8 C) (Oral)  Resp 12  Ht 5' 5.5" (1.664 m)  Wt 205 lb (92.987 kg)  BMI 33.58 kg/m2  SpO2 100%  Physical Exam  Constitutional: He appears well-developed and well-nourished.  HENT:  Head: Atraumatic.  Neck: Normal range of motion.  Cardiovascular:  Pulses equal bilaterally  Musculoskeletal: He exhibits tenderness.       Left wrist: He exhibits bony tenderness (over 1st carpal metacarpal joint. ).       Right knee: He exhibits swelling and bony tenderness. He exhibits no erythema. Tenderness found. Lateral joint line tenderness noted.  Positive scaphoid tenderness. Full radial pulse. Distal sensation is normal. Less than 2 sec cap refill. TTP to right patella and lateral joint space.  Patellar tendon intact. No obvious disruption of  quadriceps tendon.  Pt is unable to SLR at the hip secondary to knee pain.  Knee is held in approx 25 degrees of flexion and is unwilling to allow either passive or active ext/flex.  Neurological: He is alert. He has normal strength. He displays normal reflexes. No sensory deficit.  Skin: Skin is warm and dry.  Mild abrasion to proximal right forearm  Psychiatric: He has a normal mood and affect.    ED Course  Procedures (including critical care time)  DIAGNOSTIC STUDIES: Oxygen Saturation is 100% on RA, normal by my interpretation.    COORDINATION OF CARE:    Labs Review Labs Reviewed - No data to display  Imaging Review Dg Wrist Complete Left  02/10/2015   CLINICAL DATA:  Status post fall today with pain in the radial side of the wrist.  EXAM: LEFT WRIST - COMPLETE 3+ VIEW  COMPARISON:  None.  FINDINGS: There is no evidence of fracture or dislocation. There is diffuse osteopenia. There is chronic deformity of the carpal bones, metacarpals, visualize ulna and radius.  IMPRESSION: No acute fracture or dislocation. 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   Electronically Signed   By: Sherian Rein M.D.   On: 02/10/2015 13:57   Dg Knee Complete 4 Views Right  02/10/2015   CLINICAL DATA:  Status post fall with trauma to the front of right knee. Pain and swelling of the right knee.  EXAM: RIGHT KNEE - COMPLETE 4+ VIEW  COMPARISON:  CT of the right knee March 12 14  FINDINGS: There is no evidence of fracture, dislocation. There is diffuse osteopenia. There is prior fixation of the distal femur. Degenerative joint changes with narrowed joint space, osteophyte formation and small suprapatellar effusion are noted.  IMPRESSION: No acute fracture or dislocation. Diffuse osteopenia. Prior fixation of distal femur.   Electronically Signed   By: Sherian Rein M.D.   On: 02/10/2015 14:05     EKG Interpretation None      MDM   Final diagnoses:  Knee contusion, right, initial  encounter  Wrist sprain, left, initial encounter   Patients labs and/or radiological studies were reviewed and considered during the medical decision making and disposition process.  Results were also discussed with patient.   Pt was given oxycodone and ice pack with little relief of pain.  Ace wrap provided.  Crutches provided. Pt is unable to to stand from seated position, difficult getting out of wheelchair. Dilaudid 1 mg IM given.  Discussed with Dr Deretha Emory who saw pt during this visit. Pt was able to flex/ext better but without complete ROM once dilaudid started to improve pain.  He does have orthopedist at [facility] appt early April. Advised f/u within 1 week if sx are not improved with rest,  ice and time.   I personally performed the services described in this documentation, which was scribed in my presence. The recorded information has been reviewed and is accurate.   Burgess Amor, PA-C 02/10/15 1802  Burgess Amor, PA-C 02/10/15 [phone]

## 2015-02-10 NOTE — Discharge Instructions (Signed)
Joint Sprain A sprain is a tear or stretch in the ligaments that hold a joint together. Severe sprains may need as long as 3-6 weeks of immobilization and/or exercises to heal completely. Sprained joints should be rested and protected. If not, they can become unstable and prone to re-injury. Proper treatment can reduce your pain, shorten the period of disability, and reduce the risk of repeated injuries. TREATMENT   Rest and elevate the injured joint to reduce pain and swelling.  Apply ice packs to the injury for 20-30 minutes every 2-3 hours for the next 2-3 days.  Keep the injury wrapped in a compression bandage or splint as long as the joint is painful or as instructed by your caregiver.  Do not use the injured joint until it is completely healed to prevent re-injury and chronic instability. Follow the instructions of your caregiver.  Long-term sprain management may require exercises and/or treatment by a physical therapist. Taping or special braces may help stabilize the joint until it is completely better. SEEK MEDICAL CARE IF:   You develop increased pain or swelling of the joint.  You develop increasing redness and warmth of the joint.  You develop a fever.  It becomes stiff.  Your hand or foot gets cold or numb. Document Released: 12/30/2004 Document Revised: 02/14/2012 Document Reviewed: 12/09/2008 [employer] Patient Information 2015 [place], [place]. This information is not intended to replace advice given to you by your health care provider. Make sure you discuss any questions you have with your health care provider.   Use ice and elevation as much as possible for the next 2 days.  On Thursday, if the swelling is better, you may try a heating pad as discussed.  Take the medicines prescribed.  You may take the oxycodone prescribed for pain relief.  This will make you drowsy - do not drive within 4 hours of taking this medication.  Use the crutches for comfort, you may stop using  them if your pain resolves.  As discussed, you may need to have repeat xrays of your left wrist if your pain persist beyond the next week to rule out a possible occult (hairline ) fracture in your scaphoid bone of the wrist.

## 2015-02-10 NOTE — ED Notes (Signed)
Tripped and fell, Pain lt wrist and rt knee.

## 2015-07-01 DIAGNOSIS — J45909 Unspecified asthma, uncomplicated: Secondary | ICD-10-CM | POA: Diagnosis not present

## 2015-07-01 DIAGNOSIS — M08411 Pauciarticular juvenile rheumatoid arthritis, right shoulder: Secondary | ICD-10-CM | POA: Diagnosis not present

## 2015-07-01 DIAGNOSIS — M0800 Unspecified juvenile rheumatoid arthritis of unspecified site: Secondary | ICD-10-CM | POA: Diagnosis not present

## 2015-07-01 DIAGNOSIS — M08422 Pauciarticular juvenile rheumatoid arthritis, left elbow: Secondary | ICD-10-CM | POA: Diagnosis not present

## 2015-07-01 DIAGNOSIS — M8580 Other specified disorders of bone density and structure, unspecified site: Secondary | ICD-10-CM | POA: Diagnosis not present

## 2015-10-30 IMAGING — DX DG WRIST COMPLETE 3+V*L*
4 series · 4 of 4 positions shown · non-contrast
Comparison: None.

CLINICAL DATA: Status post fall today with pain in the radial side
of the wrist.

EXAM:
LEFT WRIST - COMPLETE 3+ VIEW

[wrist pa]
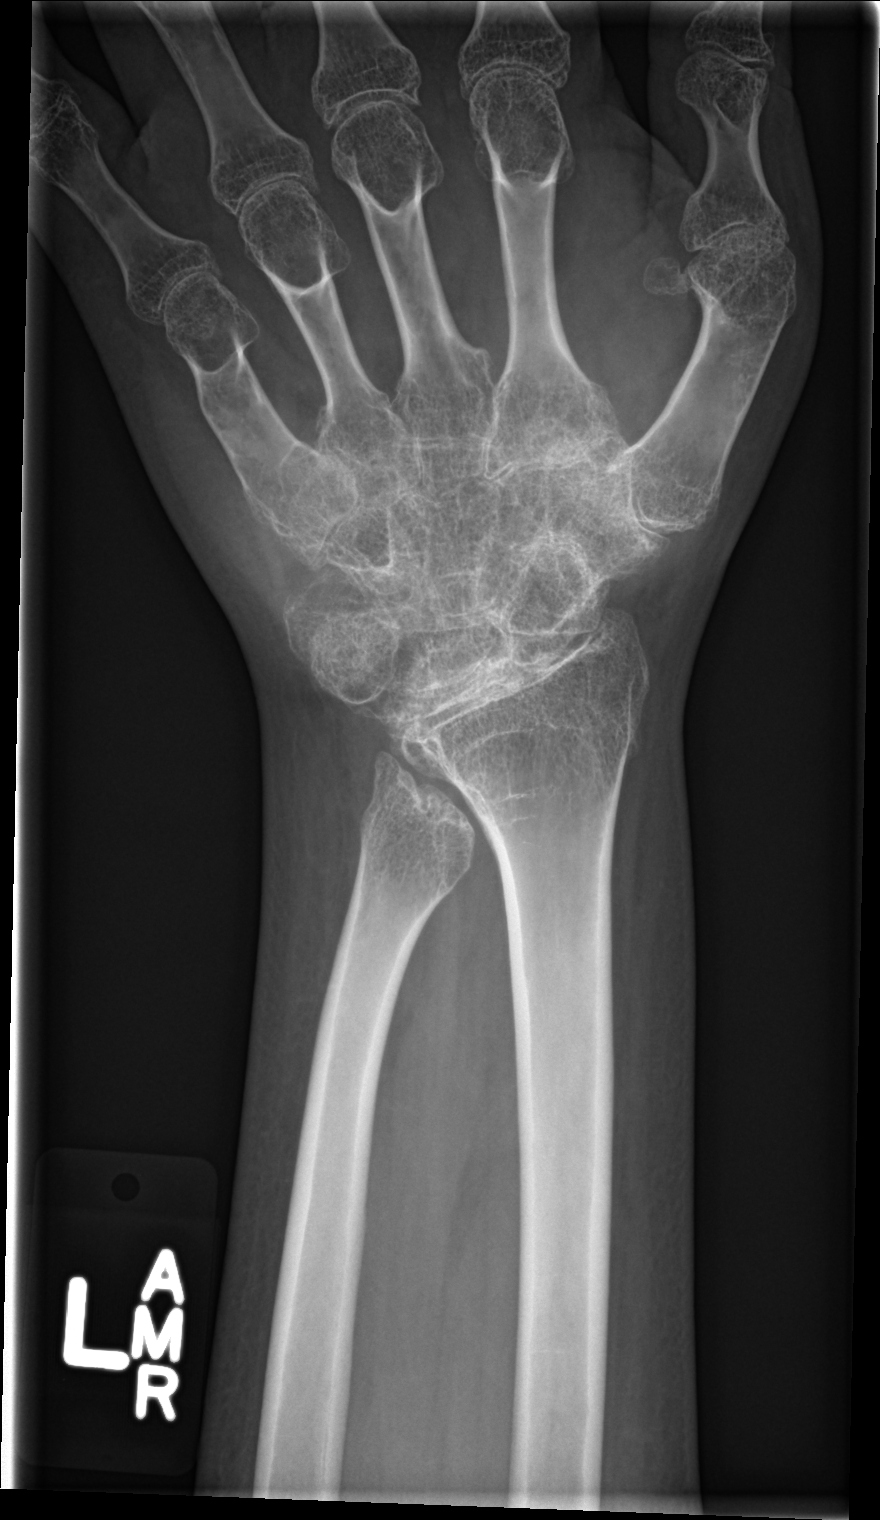

[wrist obl]
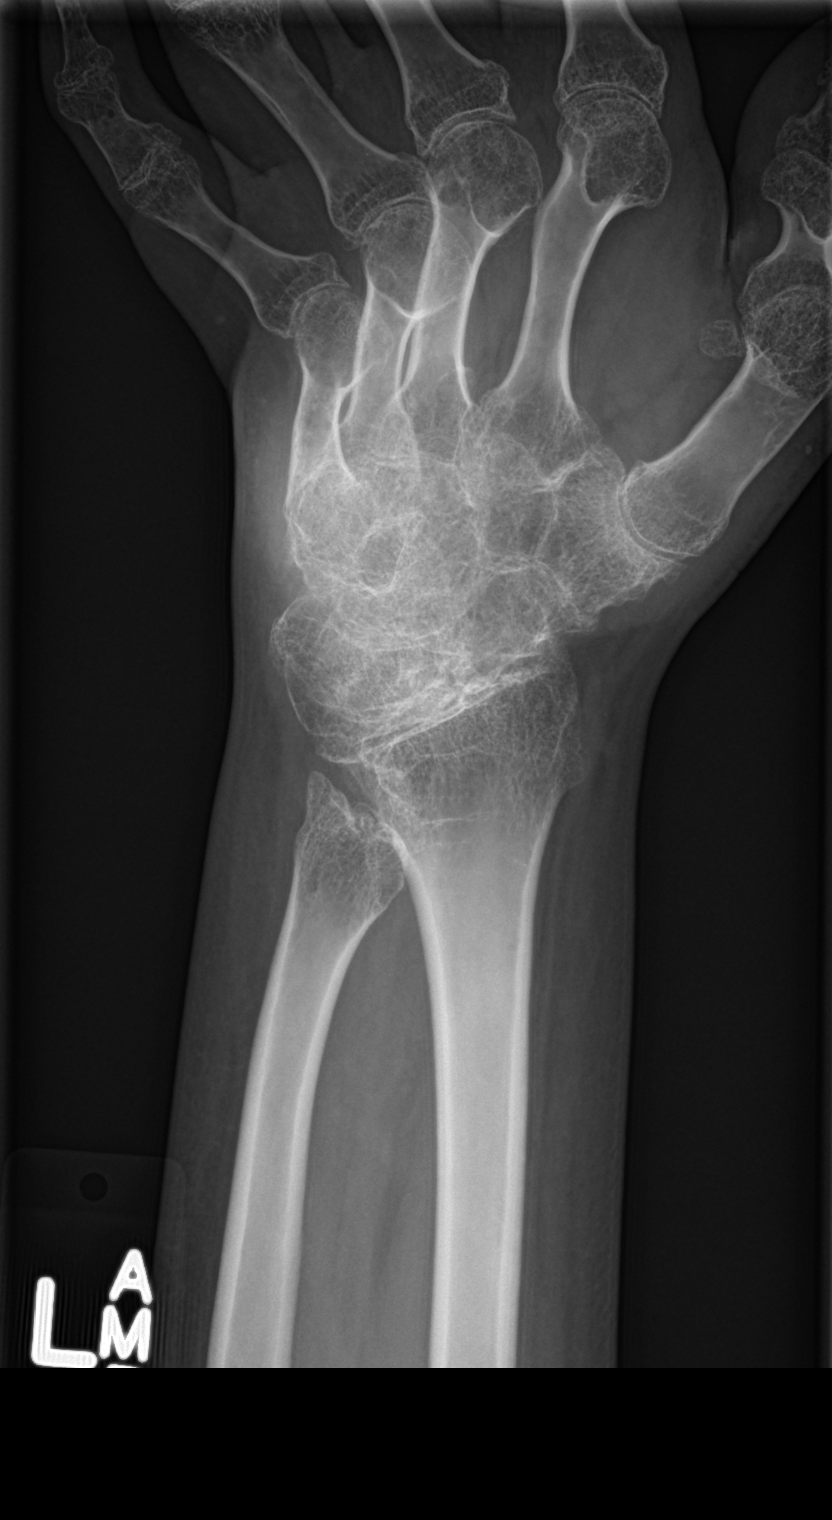

[wrist lat]
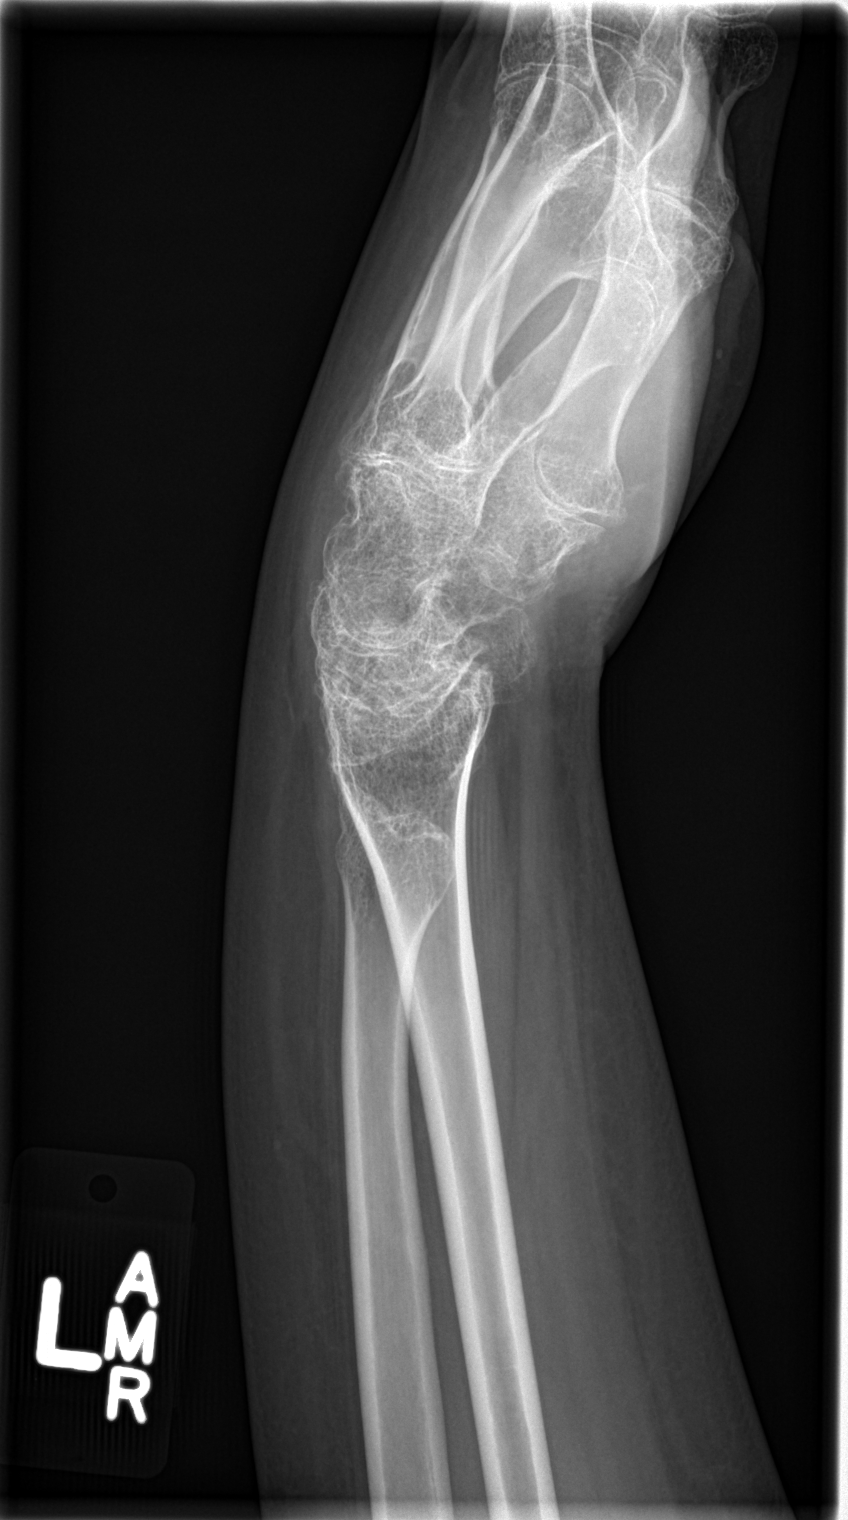

[wrist navicular]
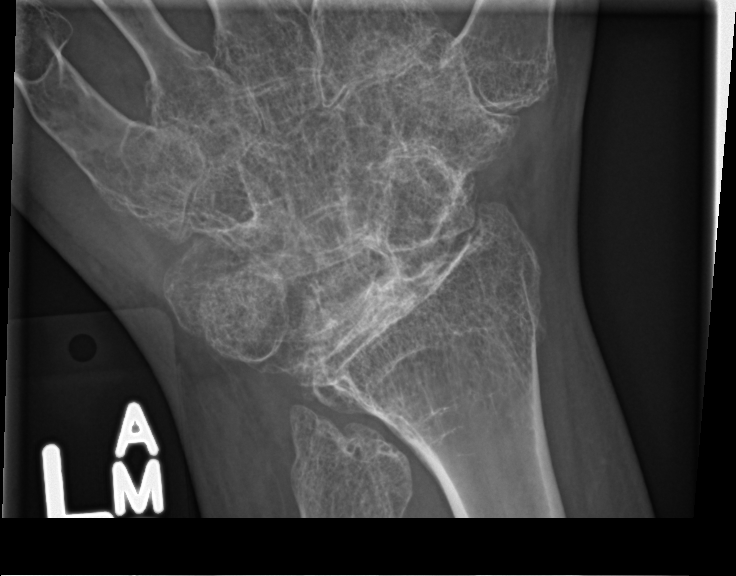

[4 of 4 positions shown; findings below may reference images not displayed]

FINDINGS: There is no evidence of fracture or dislocation. There is diffuse
osteopenia. There is chronic deformity of the carpal bones,
metacarpals, visualize ulna and radius.
IMPRESSION: No acute fracture or dislocation. Chronic deformities of the bones
of the left hand and wrist.

## 2016-07-19 DIAGNOSIS — M08079 Unspecified juvenile rheumatoid arthritis, unspecified ankle and foot: Secondary | ICD-10-CM | POA: Diagnosis not present

## 2016-07-19 DIAGNOSIS — M08031 Unspecified juvenile rheumatoid arthritis, right wrist: Secondary | ICD-10-CM | POA: Diagnosis not present

## 2016-07-19 DIAGNOSIS — M08069 Unspecified juvenile rheumatoid arthritis, unspecified knee: Secondary | ICD-10-CM | POA: Diagnosis not present

## 2016-07-19 DIAGNOSIS — E6609 Other obesity due to excess calories: Secondary | ICD-10-CM | POA: Diagnosis not present

## 2016-07-19 DIAGNOSIS — M08032 Unspecified juvenile rheumatoid arthritis, left wrist: Secondary | ICD-10-CM | POA: Diagnosis not present

## 2016-07-19 DIAGNOSIS — Z6830 Body mass index (BMI) 30.0-30.9, adult: Secondary | ICD-10-CM | POA: Diagnosis not present

## 2016-07-19 DIAGNOSIS — J45909 Unspecified asthma, uncomplicated: Secondary | ICD-10-CM | POA: Diagnosis not present

## 2016-10-20 DIAGNOSIS — J45909 Unspecified asthma, uncomplicated: Secondary | ICD-10-CM | POA: Diagnosis not present

## 2016-10-20 DIAGNOSIS — E663 Overweight: Secondary | ICD-10-CM | POA: Diagnosis not present

## 2016-10-20 DIAGNOSIS — M08031 Unspecified juvenile rheumatoid arthritis, right wrist: Secondary | ICD-10-CM | POA: Diagnosis not present

## 2016-10-20 DIAGNOSIS — Z23 Encounter for immunization: Secondary | ICD-10-CM | POA: Diagnosis not present

## 2017-04-26 DIAGNOSIS — I358 Other nonrheumatic aortic valve disorders: Secondary | ICD-10-CM | POA: Diagnosis not present

## 2017-04-26 DIAGNOSIS — M160 Bilateral primary osteoarthritis of hip: Secondary | ICD-10-CM | POA: Diagnosis not present

## 2017-04-26 DIAGNOSIS — M0890 Juvenile arthritis, unspecified, unspecified site: Secondary | ICD-10-CM | POA: Diagnosis not present

## 2017-04-26 DIAGNOSIS — M25552 Pain in left hip: Secondary | ICD-10-CM | POA: Diagnosis not present

## 2017-04-26 DIAGNOSIS — M25551 Pain in right hip: Secondary | ICD-10-CM | POA: Diagnosis not present

## 2017-05-13 DIAGNOSIS — I358 Other nonrheumatic aortic valve disorders: Secondary | ICD-10-CM | POA: Diagnosis not present

## 2017-07-05 DIAGNOSIS — M08079 Unspecified juvenile rheumatoid arthritis, unspecified ankle and foot: Secondary | ICD-10-CM | POA: Diagnosis not present

## 2017-07-05 DIAGNOSIS — J45909 Unspecified asthma, uncomplicated: Secondary | ICD-10-CM | POA: Diagnosis not present

## 2017-07-05 DIAGNOSIS — E669 Obesity, unspecified: Secondary | ICD-10-CM | POA: Diagnosis not present

## 2017-07-05 DIAGNOSIS — M08031 Unspecified juvenile rheumatoid arthritis, right wrist: Secondary | ICD-10-CM | POA: Diagnosis not present

## 2017-09-27 ENCOUNTER — Emergency Department (HOSPITAL_COMMUNITY): Payer: Medicare Other

## 2017-09-27 ENCOUNTER — Encounter (HOSPITAL_COMMUNITY): Payer: Self-pay | Admitting: *Deleted

## 2017-09-27 ENCOUNTER — Emergency Department (HOSPITAL_COMMUNITY)
Admission: EM | Admit: 2017-09-27 | Discharge: 2017-09-27 | Disposition: A | Discharge status: Home / Self Care | Payer: Medicare Other | Attending: Emergency Medicine | Admitting: Emergency Medicine

## 2017-09-27 DIAGNOSIS — F17210 Nicotine dependence, cigarettes, uncomplicated: Secondary | ICD-10-CM | POA: Insufficient documentation

## 2017-09-27 DIAGNOSIS — Z79899 Other long term (current) drug therapy: Secondary | ICD-10-CM | POA: Diagnosis not present

## 2017-09-27 DIAGNOSIS — Z91010 Allergy to peanuts: Secondary | ICD-10-CM | POA: Diagnosis not present

## 2017-09-27 DIAGNOSIS — M79671 Pain in right foot: Secondary | ICD-10-CM | POA: Diagnosis not present

## 2017-09-27 MED ORDER — TRAMADOL HCL 50 MG PO TABS: 50.0000 mg | ORAL_TABLET | Freq: Four times a day (QID) | ORAL | 0 refills | Status: AC | PRN

## 2017-09-27 MED ORDER — TRAMADOL HCL 50 MG PO TABS
50.0000 mg | ORAL_TABLET | Freq: Once | ORAL | Status: AC
  Administered 2017-09-27: 50 mg via ORAL
  Filled 2017-09-27: qty 1

## 2017-09-27 NOTE — ED Provider Notes (Signed)
[facility] EMERGENCY DEPARTMENT Provider Note   CSN: [id number] Arrival date & time: 09/27/17  1358     History   Chief Complaint Chief Complaint  Patient presents with  . Foot Pain    HPI Billy Stevenson is a 21 y.o. male presenting for evaluation of right foot pain and swelling.  He walked to the store and to a movie, then home after which he slowly developed pain and swelling in his right midfoot.  He denies any specific injury, trips, falls or twisting injuries and denies new footwear.  His pain is constant, throbbing and has progressed to where it now radiates into his ankle. Past medical history significant for rheumatoid arthritis, not currently on medications for this as this condition has been stable.  He has had no medications prior to arrival for his pain.   The history is provided by the patient.    Past Medical History:  Diagnosis Date  . Arthritis    juvenile  . Status post arthroscopy of left knee    meniscal repair    Patient Active Problem List   Diagnosis Date Noted  . Difficulty in walking(719.7) 06/11/2013  . Weakness of both legs 06/11/2013  . Stiffness of right knee 06/11/2013  . Abnormality of gait 09/22/2011  . Muscle weakness (generalized) 09/22/2011    Past Surgical History:  Procedure Laterality Date  . FEMUR FRACTURE SURGERY    . KNEE SURGERY Left    meniscal repair       Home Medications    Prior to Admission medications   Medication Sig Start Date End Date Taking? Authorizing Provider  calcium-vitamin D (OSCAL WITH D) 500-200 MG-UNIT per tablet Take 1 tablet by mouth daily.    [provider]  diclofenac (VOLTAREN) 75 MG EC tablet Take 75 mg by mouth 2 (two) times daily.    [provider]  folic acid (FOLVITE) 1 MG tablet Take 1 mg by mouth daily.    [provider]  ibuprofen (ADVIL,MOTRIN) 600 MG tablet Take 1 tablet (600 mg total) by mouth every 6 (six) hours as needed. 02/10/15   Burgess Amor, PA-C    ondansetron (ZOFRAN-ODT) 8 MG disintegrating tablet Take 1 tablet (8 mg total) by mouth every 8 (eight) hours as needed for nausea. 05/19/13   Benjiman Core, MD  oxyCODONE-acetaminophen (PERCOCET/ROXICET) 5-325 MG per tablet Take 1 tablet by mouth every 4 (four) hours as needed. 02/10/15   Burgess Amor, PA-C  traMADol (ULTRAM) 50 MG tablet Take 1 tablet (50 mg total) by mouth every 6 (six) hours as needed. 09/27/17   Burgess Amor, PA-C    Family History History reviewed. No pertinent family history.  Social History Social History  Substance Use Topics  . Smoking status: Current Some Day Smoker    Types: Cigarettes  . Smokeless tobacco: Never Used  . Alcohol use Yes     Comment: occ     Allergies   Amoxicillin and Peanut-containing drug products   Review of Systems Review of Systems  Constitutional: Negative for fever.  Musculoskeletal: Positive for arthralgias and joint swelling. Negative for myalgias.  Neurological: Negative for weakness and numbness.     Physical Exam Updated Vital Signs BP 119/77 (BP Location: Left Arm)   Pulse 94   Temp 98.3 F (36.8 C) (Oral)   Ht 5\' 6"  (1.676 m)   Wt 98 kg (216 lb)   SpO2 98%   BMI 34.86 kg/m   Physical Exam  Constitutional: He appears  well-developed and well-nourished.  HENT:  Head: Atraumatic.  Neck: Normal range of motion.  Cardiovascular:  Pulses:      Dorsalis pedis pulses are 2+ on the right side, and 2+ on the left side.  Pulses equal bilaterally  Musculoskeletal: He exhibits edema and tenderness.       Right foot: There is bony tenderness and swelling. There is normal capillary refill, no crepitus and no deformity.  Tender to palpation with mild edema mid right dorsal foot.  There is no erythema or increased warmth.  Distal sensation is intact.  Pain is worsened with movement including flexing and extending his toes but there is no radiation of pain into his toes.  Ankle and Achilles tendon is nontender.   Neurological: He is alert. He has normal strength. He displays normal reflexes. No sensory deficit.  Skin: Skin is warm and dry.  Psychiatric: He has a normal mood and affect.     ED Treatments / Results  Labs (all labs ordered are listed, but only abnormal results are displayed) Labs Reviewed - No data to display  EKG  EKG Interpretation None       Radiology Dg Foot Complete Right  Result Date: 09/27/2017 CLINICAL DATA:  Acute right foot pain without known injury. EXAM: RIGHT FOOT COMPLETE - 3+ VIEW COMPARISON:  None. FINDINGS: Status post surgical fusion of the first toe. No fracture or dislocation is noted. There appears to be fusion of the talotibial joints as well as the intertarsal joints secondary to degenerative change. No soft tissue abnormality is noted. IMPRESSION: Degenerative and postsurgical changes as described above. No acute abnormality seen in the right foot. Electronically Signed   By: Lupita Raider, M.D.   On: 09/27/2017 15:28    Procedures Procedures (including critical care time)  Medications Ordered in ED Medications  traMADol (ULTRAM) tablet 50 mg (50 mg Oral Given 09/27/17 1502)     Initial Impression / Assessment and Plan / ED Course  I have reviewed the triage vital signs and the nursing notes.  Pertinent labs & imaging results that were available during my care of the patient were reviewed by me and considered in my medical decision making (see chart for details).     Right foot pain with unclear etiology.  Patient was placed in a postop shoe, discussed elevation, rest, trial of heat therapy.  He was prescribed tramadol.  Narcotic database was consulted prior to prescription.  Plan follow-up with PCP if symptoms persist or worsen.  Final Clinical Impressions(s) / ED Diagnoses   Final diagnoses:  Foot pain, right    New Prescriptions Discharge Medication List as of 09/27/2017  4:07 PM    START taking these medications   Details   traMADol (ULTRAM) 50 MG tablet Take 1 tablet (50 mg total) by mouth every 6 (six) hours as needed., Starting Tue 09/27/2017, Print         Burgess Amor, PA-C 09/29/17 1148    Samuel Jester, [place] 09/30/17 [phone]

## 2017-09-27 NOTE — ED Triage Notes (Signed)
Pt with right foot pain for past 3 days, denies injury.

## 2017-09-27 NOTE — Discharge Instructions (Signed)
Try resting your foot, elevation and a heating pad 20 minutes several times daily. You may take the tramadol prescribed for pain relief.  This will make you drowsy - do not drive within 4 hours of taking this medication. Your xrays are negative for breaks or other source of pain.

## 2018-03-21 DIAGNOSIS — M0890 Juvenile arthritis, unspecified, unspecified site: Secondary | ICD-10-CM | POA: Diagnosis not present

## 2018-03-21 DIAGNOSIS — M7989 Other specified soft tissue disorders: Secondary | ICD-10-CM | POA: Diagnosis not present

## 2019-08-28 DIAGNOSIS — R7309 Other abnormal glucose: Secondary | ICD-10-CM | POA: Diagnosis not present

## 2019-08-28 DIAGNOSIS — L83 Acanthosis nigricans: Secondary | ICD-10-CM | POA: Diagnosis not present

## 2019-08-28 DIAGNOSIS — E669 Obesity, unspecified: Secondary | ICD-10-CM | POA: Diagnosis not present

## 2019-08-28 DIAGNOSIS — M064 Inflammatory polyarthropathy: Secondary | ICD-10-CM | POA: Diagnosis not present

## 2019-08-28 DIAGNOSIS — R638 Other symptoms and signs concerning food and fluid intake: Secondary | ICD-10-CM | POA: Diagnosis not present

## 2019-08-28 DIAGNOSIS — I10 Essential (primary) hypertension: Secondary | ICD-10-CM | POA: Diagnosis not present

## 2020-03-06 DIAGNOSIS — R109 Unspecified abdominal pain: Secondary | ICD-10-CM | POA: Diagnosis not present

## 2020-03-06 DIAGNOSIS — R197 Diarrhea, unspecified: Secondary | ICD-10-CM | POA: Diagnosis not present

## 2020-03-06 DIAGNOSIS — F17200 Nicotine dependence, unspecified, uncomplicated: Secondary | ICD-10-CM | POA: Diagnosis not present

## 2020-03-06 DIAGNOSIS — R112 Nausea with vomiting, unspecified: Secondary | ICD-10-CM | POA: Diagnosis not present

## 2020-03-06 DIAGNOSIS — D72829 Elevated white blood cell count, unspecified: Secondary | ICD-10-CM | POA: Diagnosis not present

## 2020-07-22 DIAGNOSIS — Z880 Allergy status to penicillin: Secondary | ICD-10-CM | POA: Diagnosis not present

## 2020-07-22 DIAGNOSIS — R05 Cough: Secondary | ICD-10-CM | POA: Diagnosis not present

## 2020-07-22 DIAGNOSIS — Z9119 Patient's noncompliance with other medical treatment and regimen: Secondary | ICD-10-CM | POA: Diagnosis not present

## 2020-07-22 DIAGNOSIS — Z720 Tobacco use: Secondary | ICD-10-CM | POA: Diagnosis not present

## 2020-07-22 DIAGNOSIS — F419 Anxiety disorder, unspecified: Secondary | ICD-10-CM | POA: Diagnosis not present

## 2020-07-22 DIAGNOSIS — F410 Panic disorder [episodic paroxysmal anxiety] without agoraphobia: Secondary | ICD-10-CM | POA: Diagnosis not present

## 2020-07-22 DIAGNOSIS — J1282 Pneumonia due to coronavirus disease 2019: Secondary | ICD-10-CM | POA: Diagnosis not present

## 2020-07-22 DIAGNOSIS — R0902 Hypoxemia: Secondary | ICD-10-CM | POA: Diagnosis not present

## 2020-07-22 DIAGNOSIS — K449 Diaphragmatic hernia without obstruction or gangrene: Secondary | ICD-10-CM | POA: Diagnosis not present

## 2020-07-22 DIAGNOSIS — F17200 Nicotine dependence, unspecified, uncomplicated: Secondary | ICD-10-CM | POA: Diagnosis present

## 2020-07-22 DIAGNOSIS — U071 COVID-19: Secondary | ICD-10-CM | POA: Diagnosis not present

## 2020-07-22 DIAGNOSIS — M069 Rheumatoid arthritis, unspecified: Secondary | ICD-10-CM | POA: Diagnosis not present

## 2020-07-22 DIAGNOSIS — R590 Localized enlarged lymph nodes: Secondary | ICD-10-CM | POA: Diagnosis not present

## 2020-07-22 DIAGNOSIS — R1110 Vomiting, unspecified: Secondary | ICD-10-CM | POA: Diagnosis not present

## 2022-02-08 DIAGNOSIS — J45909 Unspecified asthma, uncomplicated: Secondary | ICD-10-CM | POA: Diagnosis not present

## 2022-02-08 DIAGNOSIS — M25562 Pain in left knee: Secondary | ICD-10-CM | POA: Diagnosis not present

## 2022-02-08 DIAGNOSIS — Z6836 Body mass index (BMI) 36.0-36.9, adult: Secondary | ICD-10-CM | POA: Diagnosis not present

## 2022-02-08 DIAGNOSIS — I10 Essential (primary) hypertension: Secondary | ICD-10-CM | POA: Diagnosis not present

## 2022-11-30 DIAGNOSIS — M08032 Unspecified juvenile rheumatoid arthritis, left wrist: Secondary | ICD-10-CM | POA: Diagnosis not present

## 2022-11-30 DIAGNOSIS — I10 Essential (primary) hypertension: Secondary | ICD-10-CM | POA: Diagnosis not present

## 2022-11-30 DIAGNOSIS — M08071 Unspecified juvenile rheumatoid arthritis, right ankle and foot: Secondary | ICD-10-CM | POA: Diagnosis not present

## 2022-11-30 DIAGNOSIS — M08031 Unspecified juvenile rheumatoid arthritis, right wrist: Secondary | ICD-10-CM | POA: Diagnosis not present

## 2023-09-27 DIAGNOSIS — M25562 Pain in left knee: Secondary | ICD-10-CM | POA: Diagnosis not present

## 2023-09-27 DIAGNOSIS — M08062 Unspecified juvenile rheumatoid arthritis, left knee: Secondary | ICD-10-CM | POA: Diagnosis not present
